# Patient Record
Sex: Male | Born: 1960 | Race: Black or African American | Hispanic: No | Marital: Married
Health system: Southern US, Community
[De-identification: ages and names within clinical notes are randomized; demographics above are authoritative.]

## PROBLEM LIST (undated history)

## (undated) DIAGNOSIS — C801 Malignant (primary) neoplasm, unspecified: Secondary | ICD-10-CM

## (undated) HISTORY — PX: JOINT REPLACEMENT: SHX530

## (undated) HISTORY — PX: PROSTATECTOMY: SHX69

## (undated) HISTORY — PX: KNEE SURGERY: SHX244

---

## 2004-12-23 ENCOUNTER — Emergency Department (HOSPITAL_COMMUNITY): Admission: EM | Admit: 2004-12-23 | Discharge: 2004-12-23 | Payer: Self-pay | Admitting: Emergency Medicine

## 2005-05-11 ENCOUNTER — Emergency Department (HOSPITAL_COMMUNITY): Admission: EM | Admit: 2005-05-11 | Discharge: 2005-05-11 | Payer: Self-pay | Admitting: Emergency Medicine

## 2006-05-20 ENCOUNTER — Emergency Department (HOSPITAL_COMMUNITY): Admission: EM | Admit: 2006-05-20 | Discharge: 2006-05-20 | Payer: Self-pay | Admitting: Emergency Medicine

## 2006-05-23 ENCOUNTER — Emergency Department (HOSPITAL_COMMUNITY): Admission: EM | Admit: 2006-05-23 | Discharge: 2006-05-23 | Payer: Self-pay | Admitting: Emergency Medicine

## 2006-06-03 ENCOUNTER — Ambulatory Visit (HOSPITAL_COMMUNITY): Admission: RE | Admit: 2006-06-03 | Discharge: 2006-06-03 | Payer: Self-pay | Admitting: Family Medicine

## 2006-08-18 ENCOUNTER — Ambulatory Visit (HOSPITAL_COMMUNITY): Admission: RE | Admit: 2006-08-18 | Discharge: 2006-08-18 | Payer: Self-pay | Admitting: Family Medicine

## 2006-09-17 ENCOUNTER — Ambulatory Visit (HOSPITAL_BASED_OUTPATIENT_CLINIC_OR_DEPARTMENT_OTHER): Admission: RE | Admit: 2006-09-17 | Discharge: 2006-09-17 | Payer: Self-pay | Admitting: Urology

## 2009-12-07 ENCOUNTER — Encounter: Admission: RE | Admit: 2009-12-07 | Discharge: 2010-03-07 | Payer: Self-pay | Admitting: Nurse Practitioner

## 2010-03-09 ENCOUNTER — Encounter
Admission: RE | Admit: 2010-03-09 | Discharge: 2010-04-11 | Payer: Self-pay | Source: Home / Self Care | Admitting: Nurse Practitioner

## 2010-07-11 ENCOUNTER — Emergency Department (HOSPITAL_BASED_OUTPATIENT_CLINIC_OR_DEPARTMENT_OTHER)
Admission: EM | Admit: 2010-07-11 | Discharge: 2010-07-11 | Payer: Self-pay | Source: Home / Self Care | Admitting: Emergency Medicine

## 2010-11-16 NOTE — Op Note (Signed)
NAME:  Lawrence Ford, Lawrence Ford               ACCOUNT NO.:  [id number]   MEDICAL RECORD NO.:  [id number]          PATIENT TYPE:  AMB   LOCATION:  [facility]                         FACILITY:  [facility]   PHYSICIAN:  Courtney Paris, M.D.DATE OF BIRTH:  [birth year]   DATE OF PROCEDURE:  09/17/2006  DATE OF DISCHARGE:                               OPERATIVE REPORT   PREOPERATIVE DIAGNOSIS:  Phimosis.   POSTOPERATIVE DIAGNOSIS:  Phimosis.   OPERATION:  Circumcision.   ANESTHESIA:  General.   SURGEON:  Courtney Paris, M.D.   BRIEF HISTORY:  This 50 year old male was admitted for circumcision for  phimosis and penile irritation, particularly with intercourse.  He has  never had a UTI.  He enters to have an elective circumcision done.   DESCRIPTION OF PROCEDURE:  The patient was placed on the operating room  in the supine position. After the satisfactory induction of general  anesthesia, was prepped and draped with Betadine in the usual sterile  fashion.  The foreskin was then retracted and the prep was completed.  A  circumferential incision was made around the coronal sulcus and also on  the redundant foreskin and the Bovie with fine needle electrode was then  used to remove the redundant foreskin.  Hemostasis was achieved by  electrocoagulation and the edges of the skin were then reapproximated  with interrupted 4-0 chromic catgut suture circumferentially.  When half  of the sutures had been placed, 10 mL of 0.25% Marcaine was injected  around the base of the penis for postoperative pain relief.  The patient  was then taken to the recovery room in good condition with the dressing  of collodion, Vaseline gauze, sterile dry bandage, and Coban.  He will  be later discharged as an outpatient with detailed written instructions.      Courtney Paris, M.D.  Electronically Signed     HMK/MEDQ  D:  09/17/2006  T:  09/17/2006  Job:  [id number]

## 2012-01-04 ENCOUNTER — Encounter (HOSPITAL_COMMUNITY): Payer: Self-pay | Admitting: *Deleted

## 2012-01-04 ENCOUNTER — Emergency Department (HOSPITAL_COMMUNITY)
Admission: EM | Admit: 2012-01-04 | Discharge: 2012-01-04 | Disposition: A | Discharge status: Home / Self Care | Attending: Emergency Medicine | Admitting: Emergency Medicine

## 2012-01-04 DIAGNOSIS — W57XXXA Bitten or stung by nonvenomous insect and other nonvenomous arthropods, initial encounter: Secondary | ICD-10-CM

## 2012-01-04 DIAGNOSIS — IMO0001 Reserved for inherently not codable concepts without codable children: Secondary | ICD-10-CM | POA: Insufficient documentation

## 2012-01-04 DIAGNOSIS — Z859 Personal history of malignant neoplasm, unspecified: Secondary | ICD-10-CM | POA: Insufficient documentation

## 2012-01-04 DIAGNOSIS — Z87891 Personal history of nicotine dependence: Secondary | ICD-10-CM | POA: Insufficient documentation

## 2012-01-04 HISTORY — DX: Malignant (primary) neoplasm, unspecified: C80.1

## 2012-01-04 MED ORDER — DIPHENHYDRAMINE HCL 25 MG PO CAPS: 25.0000 mg | ORAL_CAPSULE | Freq: Four times a day (QID) | ORAL | Status: DC | PRN

## 2012-01-04 MED ORDER — IBUPROFEN 800 MG PO TABS: 800.0000 mg | ORAL_TABLET | Freq: Once | ORAL | Status: AC

## 2012-01-04 MED ORDER — DIPHENHYDRAMINE HCL 25 MG PO CAPS
25.0000 mg | ORAL_CAPSULE | Freq: Once | ORAL | Status: AC
  Administered 2012-01-04: 25 mg via ORAL
  Filled 2012-01-04: qty 1

## 2012-01-04 MED ORDER — IBUPROFEN 800 MG PO TABS
800.0000 mg | ORAL_TABLET | Freq: Once | ORAL | Status: AC
  Administered 2012-01-04: 800 mg via ORAL
  Filled 2012-01-04: qty 1

## 2012-01-04 NOTE — ED Provider Notes (Signed)
History     CSN: [id number]  Arrival date & time 01/04/12  0017   None     Chief Complaint  Patient presents with  . Insect Bite    (Consider location/radiation/quality/duration/timing/severity/associated sxs/prior treatment) HPI Comments: Insect bite to upper posterior R arm 4 days ago  Has been red and itchy since   The history is provided by the patient.    Past Medical History  Diagnosis Date  . Cancer     Past Surgical History  Procedure Date  . Joint replacement     History reviewed. No pertinent family history.  History  Substance Use Topics  . Smoking status: Former Games developer  . Smokeless tobacco: Not on file  . Alcohol Use: No      Review of Systems  Constitutional: Negative for fever and chills.  Musculoskeletal: Negative for myalgias and joint swelling.  Skin: Negative for pallor, rash and wound.  Neurological: Negative for dizziness and weakness.    Allergies  Review of patient's allergies indicates no known allergies.  Home Medications   Current Outpatient Rx  Name Route Sig Dispense Refill  . HYDROCODONE-ACETAMINOPHEN 5-500 MG PO TABS Oral Take 1 tablet by mouth every 6 (six) hours as needed. For pain    . DIPHENHYDRAMINE HCL 25 MG PO CAPS Oral Take 1 capsule (25 mg total) by mouth every 6 (six) hours as needed for itching. 30 capsule 0  . IBUPROFEN 800 MG PO TABS Oral Take 1 tablet (800 mg total) by mouth once. 30 tablet 0    BP 118/84  Pulse 69  Temp 98.1 F (36.7 C) (Oral)  Resp 18  SpO2 99%  Physical Exam  Constitutional: He appears well-developed and well-nourished.  HENT:  Head: Normocephalic.  Eyes: Pupils are equal, round, and reactive to light.  Neck: Normal range of motion.  Cardiovascular: Normal rate.   Pulmonary/Chest: Effort normal.  Abdominal: Soft.  Genitourinary: Penis normal.  Neurological: He is alert.  Skin: Skin is warm. There is erythema.       Small area posterior R upper arm slightly reddened non tender  no indication of abscess     ED Course  Procedures (including critical care time)  Labs Reviewed - No data to display No results found.   1. Insect bite of arm, right       MDM  Insect bite with local reaction        Arman Filter, NP 01/04/12 0231  Arman Filter, NP 01/04/12 [phone]

## 2012-01-04 NOTE — ED Provider Notes (Signed)
Medical screening examination/treatment/procedure(s) were performed by non-physician practitioner and as supervising physician I was immediately available for consultation/collaboration.   Malajah Oceguera, MD 01/04/12 0437 

## 2012-01-04 NOTE — ED Notes (Addendum)
Pt c/o itching and swelling after being bitten by unknown insect on posterior R upper arm. Relieved w/ hydrocodone-apap that he took at 2230 Friday. Pt's arm is warmer to the touch.

## 2012-06-04 ENCOUNTER — Ambulatory Visit: Payer: Non-veteran care | Attending: Physician Assistant

## 2012-06-04 DIAGNOSIS — IMO0001 Reserved for inherently not codable concepts without codable children: Secondary | ICD-10-CM | POA: Insufficient documentation

## 2012-06-04 DIAGNOSIS — R269 Unspecified abnormalities of gait and mobility: Secondary | ICD-10-CM | POA: Insufficient documentation

## 2012-06-04 DIAGNOSIS — M25669 Stiffness of unspecified knee, not elsewhere classified: Secondary | ICD-10-CM | POA: Insufficient documentation

## 2012-06-04 DIAGNOSIS — M25569 Pain in unspecified knee: Secondary | ICD-10-CM | POA: Insufficient documentation

## 2012-06-04 DIAGNOSIS — M6281 Muscle weakness (generalized): Secondary | ICD-10-CM | POA: Insufficient documentation

## 2012-06-05 ENCOUNTER — Ambulatory Visit: Payer: Non-veteran care

## 2012-06-08 ENCOUNTER — Ambulatory Visit: Payer: Non-veteran care | Admitting: Physical Therapy

## 2012-06-10 ENCOUNTER — Ambulatory Visit: Payer: Non-veteran care

## 2012-06-12 ENCOUNTER — Ambulatory Visit: Payer: Non-veteran care | Admitting: Physical Therapy

## 2012-06-15 ENCOUNTER — Ambulatory Visit: Payer: Non-veteran care

## 2012-06-17 ENCOUNTER — Ambulatory Visit: Payer: Non-veteran care

## 2012-06-19 ENCOUNTER — Ambulatory Visit: Payer: Non-veteran care | Admitting: Physical Therapy

## 2012-06-22 ENCOUNTER — Ambulatory Visit: Payer: Non-veteran care

## 2012-06-23 ENCOUNTER — Encounter

## 2012-06-26 ENCOUNTER — Encounter: Admitting: Physical Therapy

## 2012-06-29 ENCOUNTER — Ambulatory Visit: Payer: Non-veteran care | Admitting: Physical Therapy

## 2012-06-30 ENCOUNTER — Ambulatory Visit: Payer: Non-veteran care | Admitting: Physical Therapy

## 2012-07-02 ENCOUNTER — Ambulatory Visit: Payer: Non-veteran care | Attending: Physician Assistant

## 2012-07-02 DIAGNOSIS — R269 Unspecified abnormalities of gait and mobility: Secondary | ICD-10-CM | POA: Insufficient documentation

## 2012-07-02 DIAGNOSIS — Z96659 Presence of unspecified artificial knee joint: Secondary | ICD-10-CM | POA: Insufficient documentation

## 2012-07-02 DIAGNOSIS — M6281 Muscle weakness (generalized): Secondary | ICD-10-CM | POA: Insufficient documentation

## 2012-07-02 DIAGNOSIS — M25669 Stiffness of unspecified knee, not elsewhere classified: Secondary | ICD-10-CM | POA: Insufficient documentation

## 2012-07-02 DIAGNOSIS — IMO0001 Reserved for inherently not codable concepts without codable children: Secondary | ICD-10-CM | POA: Insufficient documentation

## 2012-07-02 DIAGNOSIS — M25569 Pain in unspecified knee: Secondary | ICD-10-CM | POA: Insufficient documentation

## 2012-07-06 ENCOUNTER — Ambulatory Visit: Payer: Non-veteran care | Admitting: Physical Therapy

## 2012-07-08 ENCOUNTER — Ambulatory Visit: Payer: Non-veteran care

## 2012-07-10 ENCOUNTER — Ambulatory Visit: Payer: Non-veteran care | Admitting: Physical Therapy

## 2012-07-13 ENCOUNTER — Encounter

## 2012-07-14 ENCOUNTER — Ambulatory Visit: Payer: Non-veteran care | Admitting: Physical Therapy

## 2012-07-15 ENCOUNTER — Encounter

## 2012-07-16 ENCOUNTER — Ambulatory Visit: Payer: Non-veteran care

## 2012-07-17 ENCOUNTER — Encounter: Admitting: Physical Therapy

## 2012-07-20 ENCOUNTER — Ambulatory Visit: Payer: Non-veteran care | Admitting: Physical Therapy

## 2012-07-22 ENCOUNTER — Ambulatory Visit: Payer: Non-veteran care

## 2012-07-24 ENCOUNTER — Ambulatory Visit: Payer: Non-veteran care | Admitting: Physical Therapy

## 2012-07-27 ENCOUNTER — Ambulatory Visit: Payer: Non-veteran care | Admitting: Physical Therapy

## 2012-07-29 ENCOUNTER — Ambulatory Visit: Payer: Non-veteran care

## 2012-07-30 ENCOUNTER — Ambulatory Visit: Payer: Non-veteran care | Admitting: Physical Therapy

## 2012-07-31 ENCOUNTER — Encounter: Admitting: Physical Therapy

## 2012-08-03 ENCOUNTER — Ambulatory Visit: Payer: Non-veteran care | Attending: Physician Assistant | Admitting: Physical Therapy

## 2012-08-03 DIAGNOSIS — IMO0001 Reserved for inherently not codable concepts without codable children: Secondary | ICD-10-CM | POA: Insufficient documentation

## 2012-08-03 DIAGNOSIS — M25669 Stiffness of unspecified knee, not elsewhere classified: Secondary | ICD-10-CM | POA: Insufficient documentation

## 2012-08-03 DIAGNOSIS — M6281 Muscle weakness (generalized): Secondary | ICD-10-CM | POA: Insufficient documentation

## 2012-08-03 DIAGNOSIS — R269 Unspecified abnormalities of gait and mobility: Secondary | ICD-10-CM | POA: Insufficient documentation

## 2012-08-03 DIAGNOSIS — M25569 Pain in unspecified knee: Secondary | ICD-10-CM | POA: Insufficient documentation

## 2012-08-03 DIAGNOSIS — Z96659 Presence of unspecified artificial knee joint: Secondary | ICD-10-CM | POA: Insufficient documentation

## 2012-08-05 ENCOUNTER — Ambulatory Visit: Payer: Non-veteran care

## 2012-08-07 ENCOUNTER — Encounter: Admitting: Physical Therapy

## 2012-08-10 ENCOUNTER — Ambulatory Visit: Payer: Non-veteran care | Admitting: Physical Therapy

## 2012-08-12 ENCOUNTER — Ambulatory Visit: Payer: Non-veteran care

## 2012-08-14 ENCOUNTER — Encounter: Admitting: Physical Therapy

## 2012-08-17 ENCOUNTER — Ambulatory Visit: Payer: Non-veteran care | Admitting: Physical Therapy

## 2012-08-19 ENCOUNTER — Ambulatory Visit: Payer: Non-veteran care

## 2012-08-21 ENCOUNTER — Encounter: Admitting: Physical Therapy

## 2012-08-24 ENCOUNTER — Ambulatory Visit: Payer: Non-veteran care | Admitting: Physical Therapy

## 2012-08-26 ENCOUNTER — Ambulatory Visit: Payer: Non-veteran care

## 2012-08-28 ENCOUNTER — Encounter: Admitting: Physical Therapy

## 2012-08-31 ENCOUNTER — Ambulatory Visit: Payer: Non-veteran care | Attending: Physician Assistant | Admitting: Physical Therapy

## 2012-08-31 DIAGNOSIS — R269 Unspecified abnormalities of gait and mobility: Secondary | ICD-10-CM | POA: Insufficient documentation

## 2012-08-31 DIAGNOSIS — M25669 Stiffness of unspecified knee, not elsewhere classified: Secondary | ICD-10-CM | POA: Insufficient documentation

## 2012-08-31 DIAGNOSIS — IMO0001 Reserved for inherently not codable concepts without codable children: Secondary | ICD-10-CM | POA: Insufficient documentation

## 2012-08-31 DIAGNOSIS — M25569 Pain in unspecified knee: Secondary | ICD-10-CM | POA: Insufficient documentation

## 2012-08-31 DIAGNOSIS — M6281 Muscle weakness (generalized): Secondary | ICD-10-CM | POA: Insufficient documentation

## 2012-09-02 ENCOUNTER — Ambulatory Visit: Payer: Non-veteran care

## 2012-09-07 ENCOUNTER — Ambulatory Visit: Payer: Non-veteran care | Admitting: Physical Therapy

## 2012-09-09 ENCOUNTER — Ambulatory Visit: Payer: Non-veteran care | Admitting: Physical Therapy

## 2012-09-14 ENCOUNTER — Ambulatory Visit: Payer: Non-veteran care

## 2012-09-18 ENCOUNTER — Ambulatory Visit: Payer: Non-veteran care

## 2015-05-03 ENCOUNTER — Emergency Department (HOSPITAL_BASED_OUTPATIENT_CLINIC_OR_DEPARTMENT_OTHER)
Admission: EM | Admit: 2015-05-03 | Discharge: 2015-05-03 | Discharge status: Against Medical Advice | Attending: Dermatology | Admitting: Dermatology

## 2015-05-03 ENCOUNTER — Encounter (HOSPITAL_BASED_OUTPATIENT_CLINIC_OR_DEPARTMENT_OTHER): Payer: Self-pay

## 2015-05-03 DIAGNOSIS — W450XXA Nail entering through skin, initial encounter: Secondary | ICD-10-CM | POA: Diagnosis not present

## 2015-05-03 DIAGNOSIS — Y998 Other external cause status: Secondary | ICD-10-CM | POA: Diagnosis not present

## 2015-05-03 DIAGNOSIS — S99922A Unspecified injury of left foot, initial encounter: Secondary | ICD-10-CM | POA: Diagnosis not present

## 2015-05-03 DIAGNOSIS — Y9289 Other specified places as the place of occurrence of the external cause: Secondary | ICD-10-CM | POA: Insufficient documentation

## 2015-05-03 DIAGNOSIS — Y9389 Activity, other specified: Secondary | ICD-10-CM | POA: Diagnosis not present

## 2015-05-03 NOTE — ED Notes (Addendum)
Stepped on rusty nail, left foot, approx 930pm-denies nail in foot-requesting tdap

## 2015-05-04 ENCOUNTER — Emergency Department (HOSPITAL_COMMUNITY)
Admission: EM | Admit: 2015-05-04 | Discharge: 2015-05-04 | Disposition: A | Discharge status: Home / Self Care | Payer: Worker's Compensation | Attending: Emergency Medicine | Admitting: Emergency Medicine

## 2015-05-04 ENCOUNTER — Encounter (HOSPITAL_COMMUNITY): Payer: Self-pay | Admitting: Emergency Medicine

## 2015-05-04 DIAGNOSIS — Z23 Encounter for immunization: Secondary | ICD-10-CM | POA: Diagnosis not present

## 2015-05-04 DIAGNOSIS — Y9289 Other specified places as the place of occurrence of the external cause: Secondary | ICD-10-CM | POA: Diagnosis not present

## 2015-05-04 DIAGNOSIS — Y9389 Activity, other specified: Secondary | ICD-10-CM | POA: Diagnosis not present

## 2015-05-04 DIAGNOSIS — W450XXA Nail entering through skin, initial encounter: Secondary | ICD-10-CM | POA: Diagnosis not present

## 2015-05-04 DIAGNOSIS — S91331A Puncture wound without foreign body, right foot, initial encounter: Secondary | ICD-10-CM | POA: Diagnosis present

## 2015-05-04 DIAGNOSIS — Z859 Personal history of malignant neoplasm, unspecified: Secondary | ICD-10-CM | POA: Insufficient documentation

## 2015-05-04 DIAGNOSIS — Z87891 Personal history of nicotine dependence: Secondary | ICD-10-CM | POA: Diagnosis not present

## 2015-05-04 DIAGNOSIS — Y998 Other external cause status: Secondary | ICD-10-CM | POA: Diagnosis not present

## 2015-05-04 DIAGNOSIS — Z79899 Other long term (current) drug therapy: Secondary | ICD-10-CM | POA: Insufficient documentation

## 2015-05-04 MED ORDER — CIPROFLOXACIN HCL 750 MG PO TABS: 750.0000 mg | ORAL_TABLET | Freq: Two times a day (BID) | ORAL | Status: DC

## 2015-05-04 MED ORDER — TETANUS-DIPHTH-ACELL PERTUSSIS 5-2.5-18.5 LF-MCG/0.5 IM SUSP
0.5000 mL | Freq: Once | INTRAMUSCULAR | Status: AC
  Administered 2015-05-04: 0.5 mL via INTRAMUSCULAR
  Filled 2015-05-04: qty 0.5

## 2015-05-04 NOTE — ED Notes (Signed)
Pt comfortable with discharge and follow up instructions. Pt declines wheelchair, escorted to waiting area by this RN. Prescriptions x1. 

## 2015-05-04 NOTE — ED Provider Notes (Signed)
CSN: [id number]     Arrival date & time 05/04/15  7858 History   First MD Initiated Contact with Patient 05/04/15 1010     Chief Complaint  Patient presents with  . Foot Injury     (Consider location/radiation/quality/duration/timing/severity/associated sxs/prior Treatment) HPI   Lawrence Ford is a 54 y.o M with no significant pmhx who presents to the ED c/o foot injury. Pt states that he was wearing his work boots when he stepped on a rusted nail about 3 hrs PTA. Pt states that his foot bled briefly, but was minimal. Pt washed area with soap and water and applied alcohol. Pt does not know when his last tetanus shot was. Denies pain, decrease ROM, redness or swelling of affected area, muscles rigidity.   Past Medical History  Diagnosis Date  . Cancer [facility])    Past Surgical History  Procedure Laterality Date  . Joint replacement    . Prostatectomy    . Knee surgery     History reviewed. No pertinent family history. Social History  Substance Use Topics  . Smoking status: Former Research scientist (life sciences)  . Smokeless tobacco: None  . Alcohol Use: No    Review of Systems  All other systems reviewed and are negative.     Allergies  Review of patient's allergies indicates no known allergies.  Home Medications   Prior to Admission medications   Medication Sig Start Date End Date Taking? Authorizing Provider  atorvastatin (LIPITOR) 40 MG tablet Take 20 mg by mouth daily.    Yes Historical Provider, MD  ranitidine (ZANTAC) 150 MG tablet Take 150 mg by mouth every evening.   Yes Historical Provider, MD  ciprofloxacin (CIPRO) 750 MG tablet Take 1 tablet (750 mg total) by mouth 2 (two) times daily. 05/04/15   Lawrence Chhim Tripp Jaxan Michel, PA-C   BP 108/68 mmHg  Pulse 75  Temp(Src) 97.7 F (36.5 C) (Oral)  SpO2 92% Physical Exam  Constitutional: He is oriented to person, place, and time. He appears well-developed and well-nourished. No distress.  HENT:  Head: Normocephalic and atraumatic.   Mouth/Throat: No oropharyngeal exudate.  Eyes: Conjunctivae are normal. Right eye exhibits no discharge. Left eye exhibits no discharge. No scleral icterus.  Cardiovascular: Normal rate and intact distal pulses.   Pulmonary/Chest: Effort normal.  Abdominal: Soft.  Musculoskeletal: Normal range of motion. He exhibits no edema or tenderness.  No decrease ROM of ankle.   Neurological: He is alert and oriented to person, place, and time.  Skin: Skin is warm and dry. No rash noted. He is not diaphoretic. No erythema. No pallor.  <0.5 cm superficial punctate wound on plantar surface of R foot. No edema, erythema, ecchymosis.   Psychiatric: He has a normal mood and affect. His behavior is normal.  Nursing note and vitals reviewed.   ED Course  Procedures (including critical care time) Labs Review Labs Reviewed - No data to display  Imaging Review No results found. I have personally reviewed and evaluated these images and lab results as part of my medical decision-making.   EKG Interpretation None      MDM   Final diagnoses:  Puncture wound of skin from metal nail    Otherwise healthy 54 y.o M presents requesting tetanus shot after stepping on rusty nail at work. Pt was wearing work boots. <0.5 cm punctate wound on plantar surface of R foot. No concern for foreign body. Pt brought nail with him to ED. Will update tetanus. Will d/c with cipro to cover pseudomonas  as pt was wearing shoe when injury occurred. Pt in NAD, VSS. Return precautions outlined in patient discharge instructions.      Dondra Spry [place], PA-C 05/04/15 [place], MD 05/05/15 1014

## 2015-05-04 NOTE — ED Notes (Signed)
Pt from home with c/o left foot injury from stepping on a nail in a wooden board last night at work.  Pt was wearing regular soled shoes.  Unknown last tetanus shot date.  Mild redness and swelling noted to bottom of foot.  All of nail was removed.  NAD, A&O.

## 2015-05-04 NOTE — Discharge Instructions (Signed)
Return to the Emergency Department if you experience fever, redness, swelling or drainage around wound, stiffening of muscles. Keep area clean and dry.

## 2016-10-22 ENCOUNTER — Encounter (HOSPITAL_COMMUNITY): Payer: Self-pay | Admitting: Emergency Medicine

## 2016-10-22 ENCOUNTER — Emergency Department (HOSPITAL_COMMUNITY)
Admission: EM | Admit: 2016-10-22 | Discharge: 2016-10-22 | Disposition: A | Discharge status: Home / Self Care | Payer: Non-veteran care | Attending: Dermatology | Admitting: Dermatology

## 2016-10-22 DIAGNOSIS — Z5321 Procedure and treatment not carried out due to patient leaving prior to being seen by health care provider: Secondary | ICD-10-CM | POA: Diagnosis not present

## 2016-10-22 DIAGNOSIS — M7989 Other specified soft tissue disorders: Secondary | ICD-10-CM | POA: Diagnosis not present

## 2016-10-22 NOTE — ED Notes (Signed)
Pt stated that the wait is too long and he is leaving

## 2016-10-22 NOTE — ED Triage Notes (Signed)
Pt from home.  Hx bilat knee replacement, PCP doc at [facility] not available and sent here.  Reports front L knee pain that migrated to back of calf and down leg.  Visible swelling on L calf only.  Ambulatory.  No hx clots, not on blood thinners.

## 2016-11-22 ENCOUNTER — Ambulatory Visit (HOSPITAL_COMMUNITY)
Admission: EM | Admit: 2016-11-22 | Discharge: 2016-11-22 | Disposition: A | Discharge status: Home / Self Care | Attending: Internal Medicine | Admitting: Internal Medicine

## 2016-11-22 ENCOUNTER — Encounter (HOSPITAL_COMMUNITY): Payer: Self-pay | Admitting: *Deleted

## 2016-11-22 DIAGNOSIS — S0181XA Laceration without foreign body of other part of head, initial encounter: Secondary | ICD-10-CM | POA: Diagnosis not present

## 2016-11-22 MED ORDER — ONDANSETRON 4 MG PO TBDP: 4.0000 mg | ORAL_TABLET | Freq: Once | ORAL | Status: DC

## 2016-11-22 NOTE — ED Provider Notes (Signed)
CSN: [id number]     Arrival date & time 11/22/16  1415 History   None    Chief Complaint  Patient presents with  . Laceration   (Consider location/radiation/quality/duration/timing/severity/associated sxs/prior Treatment) Patient accidentally cut his right forearm with a knife and his TD is UTD.   The history is provided by the patient.  Laceration  Location:  Shoulder/arm Shoulder/arm laceration location:  R forearm Length:  1 cm Depth:  Through dermis Quality: straight   Bleeding: venous and controlled   Time since incident:  1 hour Laceration mechanism:  Knife Pain details:    Quality:  Aching   Timing:  Constant Foreign body present:  No foreign bodies Relieved by:  Nothing   Past Medical History:  Diagnosis Date  . Cancer [facility])    prostate   Past Surgical History:  Procedure Laterality Date  . JOINT REPLACEMENT    . KNEE SURGERY    . PROSTATECTOMY     Family History  Problem Relation Age of Onset  . Hypertension Father    Social History  Substance Use Topics  . Smoking status: Former Research scientist (life sciences)  . Smokeless tobacco: Never Used  . Alcohol use No    Review of Systems  Constitutional: Negative.   HENT: Negative.   Eyes: Negative.   Respiratory: Negative.   Cardiovascular: Negative.   Gastrointestinal: Negative.   Endocrine: Negative.   Genitourinary: Negative.   Musculoskeletal: Negative.   Skin: Positive for wound.  Allergic/Immunologic: Negative.   Neurological: Negative.   Hematological: Negative.   Psychiatric/Behavioral: Negative.     Allergies  Patient has no known allergies.  Home Medications   Prior to Admission medications   Medication Sig Start Date End Date Taking? Authorizing Provider  atorvastatin (LIPITOR) 40 MG tablet Take 20 mg by mouth daily.    Yes [provider]  ranitidine (ZANTAC) 150 MG tablet Take 150 mg by mouth every evening.   Yes [provider]  ciprofloxacin (CIPRO) 750 MG tablet Take 1 tablet  (750 mg total) by mouth 2 (two) times daily. 05/04/15   [place], Dondra Spry, PA-C   Meds Ordered and Administered this Visit  Medications - No data to display  BP 126/72   Pulse 64   Temp 98 F (36.7 C) (Oral)   Resp 17  No data found.   Physical Exam  Constitutional: He appears well-developed and well-nourished.  HENT:  Head: Normocephalic and atraumatic.  Eyes: Conjunctivae and EOM are normal. Pupils are equal, round, and reactive to light.  Neck: Normal range of motion. Neck supple.  Cardiovascular: Normal rate, regular rhythm and normal heart sounds.   Pulmonary/Chest: Effort normal and breath sounds normal.  Abdominal: Soft. Bowel sounds are normal.  Skin:  Right forearm with 1 cm laceration.  Nursing note and vitals reviewed.   Urgent Care Course     .Marland KitchenLaceration Repair Date/Time: 11/22/2016 3:45 PM Performed by: Lysbeth Penner Authorized by: Sherlene Shams   Consent:    Consent obtained:  Verbal   Consent given by:  Patient   Risks discussed:  Infection   Alternatives discussed:  No treatment Anesthesia (see MAR for exact dosages):    Anesthesia method:  None Laceration details:    Location:  Shoulder/arm   Shoulder/arm location:  R lower arm   Length (cm):  1   Depth (mm):  3 Repair type:    Repair type:  Simple Pre-procedure details:    Preparation:  Patient was prepped and draped in usual  sterile fashion Exploration:    Hemostasis achieved with:  Direct pressure   Wound exploration: wound explored through full range of motion     Contaminated: no   Treatment:    Area cleansed with:  Betadine Skin repair:    Repair method:  Tissue adhesive Approximation:    Approximation:  Close   Vermilion border: well-aligned   Post-procedure details:    Dressing:  Adhesive bandage   (including critical care time)  Labs Review Labs Reviewed - No data to display  Imaging Review No results found.   Visual Acuity Review  Right Eye Distance:    Left Eye Distance:   Bilateral Distance:    Right Eye Near:   Left Eye Near:    Bilateral Near:         MDM   1. Laceration of forehead, initial encounter    Wound adhesive applied to right FA laceration      Lysbeth Penner, FNP 11/22/16 1546

## 2016-11-22 NOTE — ED Notes (Signed)
Patients arm dressed with kerlex.

## 2016-11-22 NOTE — ED Triage Notes (Signed)
Patient reports he was doing the dishes and a knife was sticking out of the drainer and pierced his left lateral forearm. Building controled. Tetanus up to date.

## 2018-03-20 ENCOUNTER — Other Ambulatory Visit: Payer: Self-pay

## 2018-03-20 DIAGNOSIS — Z1211 Encounter for screening for malignant neoplasm of colon: Secondary | ICD-10-CM

## 2018-03-23 ENCOUNTER — Other Ambulatory Visit: Payer: Self-pay

## 2018-04-07 ENCOUNTER — Ambulatory Visit: Admitting: Anesthesiology

## 2018-04-07 ENCOUNTER — Encounter
Admission: RE | Disposition: A | Discharge status: Home / Self Care | Payer: Self-pay | Source: Ambulatory Visit | Attending: Gastroenterology

## 2018-04-07 ENCOUNTER — Ambulatory Visit
Admission: RE | Admit: 2018-04-07 | Discharge: 2018-04-07 | Disposition: A | Discharge status: Home / Self Care | Source: Ambulatory Visit | Attending: Gastroenterology | Admitting: Gastroenterology

## 2018-04-07 ENCOUNTER — Encounter: Payer: Self-pay | Admitting: Anesthesiology

## 2018-04-07 DIAGNOSIS — Z8249 Family history of ischemic heart disease and other diseases of the circulatory system: Secondary | ICD-10-CM | POA: Diagnosis not present

## 2018-04-07 DIAGNOSIS — Z8546 Personal history of malignant neoplasm of prostate: Secondary | ICD-10-CM | POA: Diagnosis not present

## 2018-04-07 DIAGNOSIS — Z9660 Presence of unspecified orthopedic joint implant: Secondary | ICD-10-CM | POA: Insufficient documentation

## 2018-04-07 DIAGNOSIS — E785 Hyperlipidemia, unspecified: Secondary | ICD-10-CM | POA: Insufficient documentation

## 2018-04-07 DIAGNOSIS — Z1211 Encounter for screening for malignant neoplasm of colon: Secondary | ICD-10-CM

## 2018-04-07 DIAGNOSIS — K641 Second degree hemorrhoids: Secondary | ICD-10-CM | POA: Diagnosis not present

## 2018-04-07 DIAGNOSIS — Z87891 Personal history of nicotine dependence: Secondary | ICD-10-CM | POA: Diagnosis not present

## 2018-04-07 DIAGNOSIS — K219 Gastro-esophageal reflux disease without esophagitis: Secondary | ICD-10-CM | POA: Insufficient documentation

## 2018-04-07 DIAGNOSIS — D123 Benign neoplasm of transverse colon: Secondary | ICD-10-CM

## 2018-04-07 DIAGNOSIS — Z79899 Other long term (current) drug therapy: Secondary | ICD-10-CM | POA: Diagnosis not present

## 2018-04-07 HISTORY — PX: COLONOSCOPY WITH PROPOFOL: SHX5780

## 2018-04-07 SURGERY — COLONOSCOPY WITH PROPOFOL
Anesthesia: General

## 2018-04-07 MED ORDER — PROPOFOL 500 MG/50ML IV EMUL
INTRAVENOUS | Status: DC | PRN
  Administered 2018-04-07: 100 ug/kg/min via INTRAVENOUS

## 2018-04-07 MED ORDER — LIDOCAINE HCL (CARDIAC) PF 100 MG/5ML IV SOSY
PREFILLED_SYRINGE | INTRAVENOUS | Status: DC | PRN
  Administered 2018-04-07: 80 mg via INTRAVENOUS

## 2018-04-07 MED ORDER — PROPOFOL 10 MG/ML IV BOLUS
INTRAVENOUS | Status: DC | PRN
  Administered 2018-04-07: 80 mg via INTRAVENOUS

## 2018-04-07 MED ORDER — SODIUM CHLORIDE 0.9 % IV SOLN
INTRAVENOUS | Status: DC
  Administered 2018-04-07: 1000 mL via INTRAVENOUS

## 2018-04-07 NOTE — Anesthesia Post-op Follow-up Note (Signed)
Anesthesia QCDR form completed.        

## 2018-04-07 NOTE — Transfer of Care (Signed)
Immediate Anesthesia Transfer of Care Note  Patient: Lawrence Ford  Procedure(s) Performed: COLONOSCOPY WITH PROPOFOL (N/A )  Patient Location: PACU  Anesthesia Type:General  Level of Consciousness: sedated  Airway & Oxygen Therapy: Patient Spontanous Breathing and Patient connected to nasal cannula oxygen  Post-op Assessment: Report given to RN and Post -op Vital signs reviewed and stable  Post vital signs: Reviewed and stable  Last Vitals:  Vitals Value Taken Time  BP    Temp    Pulse    Resp    SpO2      Last Pain:  Vitals:   04/07/18 0921  TempSrc: Tympanic  PainSc: 0-No pain         Complications: No apparent anesthesia complications

## 2018-04-07 NOTE — H&P (Signed)
Lucilla Lame, MD [address] [place], [place] [address] Phone: [phone] Fax : [phone]  Primary Care Physician:  Gale Journey, MD Primary Gastroenterologist:  Dr. Allen Norris  Pre-Procedure History & Physical: HPI:  Lawrence Ford is a 57 y.o. male is here for a screening colonoscopy.   Past Medical History:  Diagnosis Date  . Cancer [facility])    prostate    Past Surgical History:  Procedure Laterality Date  . JOINT REPLACEMENT    . KNEE SURGERY    . PROSTATECTOMY      Prior to Admission medications   Medication Sig Start Date End Date Taking? Authorizing Provider  atorvastatin (LIPITOR) 40 MG tablet Take 20 mg by mouth daily.    Yes [provider]  ciprofloxacin (CIPRO) 750 MG tablet Take 1 tablet (750 mg total) by mouth 2 (two) times daily. 05/04/15  Yes Dowless, Dondra Spry, PA-C  ranitidine (ZANTAC) 150 MG tablet Take 150 mg by mouth every evening.   Yes [provider]    Allergies as of 03/20/2018  . (No Known Allergies)    Family History  Problem Relation Age of Onset  . Hypertension Father     Social History   Socioeconomic History  . Marital status: Married    Spouse name: Not on file  . Number of children: Not on file  . Years of education: Not on file  . Highest education level: Not on file  Occupational History  . Not on file  Social Needs  . Financial resource strain: Not on file  . Food insecurity:    Worry: Not on file    Inability: Not on file  . Transportation needs:    Medical: Not on file    Non-medical: Not on file  Tobacco Use  . Smoking status: Former Research scientist (life sciences)  . Smokeless tobacco: Never Used  Substance and Sexual Activity  . Alcohol use: No  . Drug use: No  . Sexual activity: Not on file  Lifestyle  . Physical activity:    Days per week: Not on file    Minutes per session: Not on file  . Stress: Not on file  Relationships  . Social connections:    Talks on phone: Not on file   Gets together: Not on file    Attends religious service: Not on file    Active member of club or organization: Not on file    Attends meetings of clubs or organizations: Not on file    Relationship status: Not on file  . Intimate partner violence:    Fear of current or ex partner: Not on file    Emotionally abused: Not on file    Physically abused: Not on file    Forced sexual activity: Not on file  Other Topics Concern  . Not on file  Social History Narrative  . Not on file    Review of Systems: See HPI, otherwise negative ROS  Physical Exam: BP 109/78   Pulse 70   Temp (!) 97 F (36.1 C) (Tympanic)   Resp 20   Ht 5\' 7"  (1.702 m)   Wt 86.2 kg   SpO2 100%   BMI 29.76 kg/m  General:   Alert,  pleasant and cooperative in NAD Head:  Normocephalic and atraumatic. Neck:  Supple; no masses or thyromegaly. Lungs:  Clear throughout to auscultation.    Heart:  Regular rate and rhythm. Abdomen:  Soft, nontender and nondistended. Normal bowel sounds, without guarding, and without  rebound.   Neurologic:  Alert and  oriented x4;  grossly normal neurologically.  Impression/Plan: Lawrence Ford is now here to undergo a screening colonoscopy.  Risks, benefits, and alternatives regarding colonoscopy have been reviewed with the patient.  Questions have been answered.  All parties agreeable.

## 2018-04-07 NOTE — Anesthesia Preprocedure Evaluation (Signed)
Anesthesia Evaluation  Patient identified by MRN, date of birth, ID band Patient awake    Reviewed: Allergy & Precautions, H&P , NPO status , Patient's Chart, lab work & pertinent test results  History of Anesthesia Complications Negative for: history of anesthetic complications  Airway Mallampati: II  TM Distance: >3 FB Neck ROM: full    Dental  (+) Chipped   Pulmonary neg shortness of breath, former smoker,           Cardiovascular Exercise Tolerance: Good (-) angina(-) Past MI and (-) DOE      Neuro/Psych negative neurological ROS  negative psych ROS   GI/Hepatic Neg liver ROS, GERD  ,  Endo/Other  negative endocrine ROS  Renal/GU negative Renal ROS  negative genitourinary   Musculoskeletal   Abdominal   Peds  Hematology negative hematology ROS (+)   Anesthesia Other Findings HLD  Past Medical History: No date: Cancer ([place])     Comment:  prostate  Past Surgical History: No date: JOINT REPLACEMENT No date: KNEE SURGERY No date: PROSTATECTOMY     Reproductive/Obstetrics negative OB ROS                             Anesthesia Physical Anesthesia Plan  ASA: II  Anesthesia Plan: General   Post-op Pain Management:    Induction: Intravenous  PONV Risk Score and Plan: Propofol infusion and TIVA  Airway Management Planned: Natural Airway and Nasal Cannula  Additional Equipment:   Intra-op Plan:   Post-operative Plan:   Informed Consent: I have reviewed the patients History and Physical, chart, labs and discussed the procedure including the risks, benefits and alternatives for the proposed anesthesia with the patient or authorized representative who has indicated his/her understanding and acceptance.   Dental Advisory Given  Plan Discussed with: Anesthesiologist, CRNA and Surgeon  Anesthesia Plan Comments: (Patient consented for risks of anesthesia including but not  limited to:  - adverse reactions to medications - risk of intubation if required - damage to teeth, lips or other oral mucosa - sore throat or hoarseness - Damage to heart, brain, lungs or loss of life  Patient voiced understanding.)        Anesthesia Quick Evaluation

## 2018-04-07 NOTE — Anesthesia Postprocedure Evaluation (Signed)
Anesthesia Post Note  Patient: Lawrence Ford  Procedure(s) Performed: COLONOSCOPY WITH PROPOFOL (N/A )  Patient location during evaluation: Endoscopy Anesthesia Type: General Level of consciousness: awake and alert Pain management: pain level controlled Vital Signs Assessment: post-procedure vital signs reviewed and stable Respiratory status: spontaneous breathing, nonlabored ventilation, respiratory function stable and patient connected to nasal cannula oxygen Cardiovascular status: blood pressure returned to baseline and stable Postop Assessment: no apparent nausea or vomiting Anesthetic complications: no     Last Vitals:  Vitals:   04/07/18 1011 04/07/18 1021  BP: 95/65 111/73  Pulse: (!) 59 (!) 55  Resp: 18 15  Temp:    SpO2: 99% 100%    Last Pain:  Vitals:   04/07/18 1021  TempSrc:   PainSc: 0-No pain                 Precious Haws Nelly Scriven

## 2018-04-07 NOTE — Op Note (Signed)
[facility] Patient Name: Lawrence Ford Procedure Date: 04/07/2018 9:35 AM MRN: [id number] Account #: [id number] Date of Birth: 21-Jun-1961 Admit Type: Outpatient Age: 57 Room: [facility] ENDO ROOM 4 Gender: Male Note Status: Finalized Procedure:            Colonoscopy Indications:          Screening for colorectal malignant neoplasm Providers:            Lucilla Lame MD, MD Referring MD:         No Local Md, MD (Referring MD) Medicines:            Propofol per Anesthesia Complications:        No immediate complications. Procedure:            Pre-Anesthesia Assessment:                       - Prior to the procedure, a History and Physical was                        performed, and patient medications and allergies were                        reviewed. The patient's tolerance of previous                        anesthesia was also reviewed. The risks and benefits of                        the procedure and the sedation options and risks were                        discussed with the patient. All questions were                        answered, and informed consent was obtained. Prior                        Anticoagulants: The patient has taken no previous                        anticoagulant or antiplatelet agents. ASA Grade                        Assessment: II - A patient with mild systemic disease.                        After reviewing the risks and benefits, the patient was                        deemed in satisfactory condition to undergo the                        procedure.                       After obtaining informed consent, the colonoscope was                        passed under direct vision. Throughout the procedure,  the patient's blood pressure, pulse, and oxygen                        saturations were monitored continuously. The                        Colonoscope was introduced through the anus and                         advanced to the the cecum, identified by appendiceal                        orifice and ileocecal valve. The colonoscopy was                        performed without difficulty. The patient tolerated the                        procedure well. The quality of the bowel preparation                        was excellent. Findings:      The perianal and digital rectal examinations were normal.      Two sessile polyps were found in the transverse colon. The polyps were 5       to 7 mm in size. These polyps were removed with a cold snare. Resection       and retrieval were complete.      A 2 mm polyp was found in the transverse colon. The polyp was sessile.       The polyp was removed with a cold biopsy forceps. Resection and       retrieval were complete.      Non-bleeding internal hemorrhoids were found during retroflexion. The       hemorrhoids were Grade II (internal hemorrhoids that prolapse but reduce       spontaneously). Impression:           - Two 5 to 7 mm polyps in the transverse colon, removed                        with a cold snare. Resected and retrieved.                       - One 2 mm polyp in the transverse colon, removed with                        a cold biopsy forceps. Resected and retrieved.                       - Non-bleeding internal hemorrhoids. Recommendation:       - Discharge patient to home.                       - Resume previous diet.                       - Continue present medications.                       - Await pathology results.                       -  Repeat colonoscopy in 5 years if polyp adenoma and 10                        years if hyperplastic Procedure Code(s):    --- Professional ---                       [id number], Colonoscopy, flexible; with removal of tumor(s),                        polyp(s), or other lesion(s) by snare technique                       45380, 66, Colonoscopy, flexible; with biopsy, single                        or  multiple Diagnosis Code(s):    --- Professional ---                       Z12.11, Encounter for screening for malignant neoplasm                        of colon                       D12.3, Benign neoplasm of transverse colon (hepatic                        flexure or splenic flexure) CPT copyright 2018 American Medical Association. All rights reserved. The codes documented in this report are preliminary and upon coder review may  be revised to meet current compliance requirements. Lucilla Lame MD, MD 04/07/2018 9:55:19 AM This report has been signed electronically. Number of Addenda: 0 Note Initiated On: 04/07/2018 9:35 AM Scope Withdrawal Time: 0 hours 8 minutes 24 seconds  Total Procedure Duration: 0 hours 9 minutes 50 seconds       [facility]

## 2018-04-08 ENCOUNTER — Encounter: Payer: Self-pay | Admitting: Gastroenterology

## 2018-04-08 LAB — SURGICAL PATHOLOGY

## 2018-04-12 ENCOUNTER — Encounter: Payer: Self-pay | Admitting: Gastroenterology

## 2018-05-12 ENCOUNTER — Ambulatory Visit (HOSPITAL_COMMUNITY)
Admission: EM | Admit: 2018-05-12 | Discharge: 2018-05-12 | Disposition: A | Discharge status: Home / Self Care | Attending: Family Medicine | Admitting: Family Medicine

## 2018-05-12 ENCOUNTER — Ambulatory Visit (INDEPENDENT_AMBULATORY_CARE_PROVIDER_SITE_OTHER)

## 2018-05-12 ENCOUNTER — Encounter (HOSPITAL_COMMUNITY): Payer: Self-pay | Admitting: Emergency Medicine

## 2018-05-12 DIAGNOSIS — M25571 Pain in right ankle and joints of right foot: Secondary | ICD-10-CM | POA: Diagnosis not present

## 2018-05-12 DIAGNOSIS — M79671 Pain in right foot: Secondary | ICD-10-CM

## 2018-05-12 MED ORDER — PREDNISONE 20 MG PO TABS: ORAL_TABLET | ORAL | 0 refills | Status: AC

## 2018-05-12 MED ORDER — IBUPROFEN 800 MG PO TABS: 800.0000 mg | ORAL_TABLET | Freq: Three times a day (TID) | ORAL | 0 refills | Status: DC

## 2018-05-12 NOTE — ED Provider Notes (Signed)
[place]    CSN: [id number] Arrival date & time: 05/12/18  1114     History   Chief Complaint Chief Complaint  Patient presents with  . Foot Pain    HPI Lawrence Ford is a 57 y.o. male.   HPI  Pain in the right great toe joint for a few days Pain with weight bearing Walks a lot at job No trauma or fall No new shoes NO history of gout or arthritis On no Rx medicines NO ALCOHOL  Past Medical History:  Diagnosis Date  . Cancer [facility])    prostate    Patient Active Problem List   Diagnosis Date Noted  . Encounter for screening colonoscopy   . Benign neoplasm of transverse colon     Past Surgical History:  Procedure Laterality Date  . COLONOSCOPY WITH PROPOFOL N/A 04/07/2018   Procedure: COLONOSCOPY WITH PROPOFOL;  Surgeon: Lucilla Lame, MD;  Location: [facility] ENDOSCOPY;  Service: Endoscopy;  Laterality: N/A;  . JOINT REPLACEMENT    . KNEE SURGERY    . PROSTATECTOMY        Home Medications    Prior to Admission medications   Medication Sig Start Date End Date Taking? Authorizing Provider  atorvastatin (LIPITOR) 40 MG tablet Take 20 mg by mouth daily.     [provider]  ibuprofen (ADVIL,MOTRIN) 800 MG tablet Take 1 tablet (800 mg total) by mouth 3 (three) times daily. 05/12/18   Raylene Everts, MD  predniSONE (DELTASONE) 20 MG tablet Take 3 pills today and tomorrow 05/12/18   Raylene Everts, MD  ranitidine (ZANTAC) 150 MG tablet Take 150 mg by mouth every evening.    [provider]    Family History Family History  Problem Relation Age of Onset  . Hypertension Father     Social History Social History   Tobacco Use  . Smoking status: Former Research scientist (life sciences)  . Smokeless tobacco: Never Used  Substance Use Topics  . Alcohol use: No  . Drug use: No     Allergies   Patient has no known allergies.   Review of Systems Review of Systems  Constitutional: Negative for chills and fever.  HENT: Negative for ear pain  and sore throat.   Eyes: Negative for pain and visual disturbance.  Respiratory: Negative for cough and shortness of breath.   Cardiovascular: Negative for chest pain and palpitations.  Gastrointestinal: Negative for abdominal pain and vomiting.  Genitourinary: Negative for dysuria and hematuria.  Musculoskeletal: Positive for arthralgias and gait problem. Negative for back pain.  Skin: Negative for color change and rash.  Neurological: Negative for seizures and syncope.  All other systems reviewed and are negative.    Physical Exam Triage Vital Signs ED Triage Vitals  Enc Vitals Group     BP 05/12/18 1133 122/74     Pulse Rate 05/12/18 1133 78     Resp 05/12/18 1133 18     Temp 05/12/18 1133 97.8 F (36.6 C)     Temp Source 05/12/18 1133 Oral     SpO2 05/12/18 1133 99 %     Weight --      Height --      Head Circumference --      Peak Flow --      Pain Score 05/12/18 1134 5     Pain Loc --      Pain Edu? --      Excl. in [place]? --    No data found.  Updated Vital Signs BP 122/74 (BP Location: Left Arm)   Pulse 78   Temp 97.8 F (36.6 C) (Oral)   Resp 18   SpO2 99%       Physical Exam  Constitutional: He appears well-developed and well-nourished. No distress.  HENT:  Head: Normocephalic and atraumatic.  Mouth/Throat: Oropharynx is clear and moist.  Eyes: Pupils are equal, round, and reactive to light. Conjunctivae are normal.  Neck: Normal range of motion.  Cardiovascular: Normal rate.  Pulmonary/Chest: Effort normal. No respiratory distress.  Abdominal: Soft. He exhibits no distension.  Musculoskeletal: Normal range of motion. He exhibits no edema.  Mildly antalgic gait.  Tenderness around the first MTP joint.  No noticeable swelling.  No erythema or warmth.  Pain with any toe movement.  X-ray negative  Neurological: He is alert.  Skin: Skin is warm and dry.  Vitals reviewed.    UC Treatments / Results  Labs (all labs ordered are listed, but only  abnormal results are displayed) Labs Reviewed - No data to display  EKG None  Radiology Dg Foot Complete Right  Result Date: 05/12/2018 CLINICAL DATA:  Onset of right foot pain and swelling last night centered over the great toe and first MTP joint. No known injury. EXAM: RIGHT FOOT COMPLETE - 3+ VIEW COMPARISON:  None. FINDINGS: The bones are subjectively adequately mineralized. There is no acute or healing fracture. There is no lytic or blastic lesion. No significant arthropathic changes are observed. Specific attention to the great toe and the first MTP joint reveal no acute abnormalities. No soft tissue calcifications are observed and no significant soft tissue swelling is demonstrated. IMPRESSION: There is no acute or significant chronic bony or soft tissue abnormality of the right foot. Specific attention to the first ray reveals no acute abnormality. Electronically Signed   By: David  Martinique M.D.   On: 05/12/2018 12:17    Procedures Procedures (including critical care time)  Medications Ordered in UC Medications - No data to display  Initial Impression / Assessment and Plan / UC Course  I have reviewed the triage vital signs and the nursing notes.  Pertinent labs & imaging results that were available during my care of the patient were reviewed by me and considered in my medical decision making (see chart for details).     *Study had an arthralgia of the joint.  Does not appear to be tendon.  No bony structural changes.  He could have early arthritis it does not show on x-ray.  It could be gout. Final Clinical Impressions(s) / UC Diagnoses   Final diagnoses:  Foot pain, right  Arthralgia of right foot     Discharge Instructions     You have inflammation of the toe joint.  This could be caused by gout, or prolonged walking. Take the prednisone for 2 days. This is a strong anti-inflammatory After the prednisone take ibuprofen 3 times a day with food Your x-ray was  negative See your family doctor if the pain persist    ED Prescriptions    Medication Sig Dispense Auth. Provider   predniSONE (DELTASONE) 20 MG tablet Take 3 pills today and tomorrow 6 tablet Raylene Everts, MD   ibuprofen (ADVIL,MOTRIN) 800 MG tablet Take 1 tablet (800 mg total) by mouth 3 (three) times daily. 21 tablet Raylene Everts, MD     Controlled Substance Prescriptions [place] Controlled Substance Registry consulted? Not Applicable   Raylene Everts, MD 05/12/18 1339

## 2018-05-12 NOTE — Discharge Instructions (Addendum)
You have inflammation of the toe joint.  This could be caused by gout, or prolonged walking. Take the prednisone for 2 days. This is a strong anti-inflammatory After the prednisone take ibuprofen 3 times a day with food Your x-ray was negative See your family doctor if the pain persist

## 2018-05-12 NOTE — ED Triage Notes (Signed)
Pt sts right foot pain with some swelling; pt denies obvious injury

## 2019-11-03 DIAGNOSIS — L03012 Cellulitis of left finger: Secondary | ICD-10-CM | POA: Insufficient documentation

## 2019-11-03 DIAGNOSIS — Z87891 Personal history of nicotine dependence: Secondary | ICD-10-CM | POA: Insufficient documentation

## 2019-11-03 DIAGNOSIS — Z79899 Other long term (current) drug therapy: Secondary | ICD-10-CM | POA: Diagnosis not present

## 2019-11-03 DIAGNOSIS — M79645 Pain in left finger(s): Secondary | ICD-10-CM | POA: Diagnosis present

## 2019-11-03 DIAGNOSIS — Z9660 Presence of unspecified orthopedic joint implant: Secondary | ICD-10-CM | POA: Insufficient documentation

## 2019-11-04 ENCOUNTER — Encounter (HOSPITAL_COMMUNITY): Payer: Self-pay

## 2019-11-04 ENCOUNTER — Emergency Department (HOSPITAL_COMMUNITY)
Admission: EM | Admit: 2019-11-04 | Discharge: 2019-11-04 | Disposition: A | Discharge status: Home / Self Care | Payer: No Typology Code available for payment source | Attending: Emergency Medicine | Admitting: Emergency Medicine

## 2019-11-04 ENCOUNTER — Other Ambulatory Visit: Payer: Self-pay

## 2019-11-04 DIAGNOSIS — L03012 Cellulitis of left finger: Secondary | ICD-10-CM

## 2019-11-04 MED ORDER — DOXYCYCLINE HYCLATE 100 MG PO TABS
100.0000 mg | ORAL_TABLET | Freq: Once | ORAL | Status: AC
  Administered 2019-11-04: 100 mg via ORAL
  Filled 2019-11-04: qty 1

## 2019-11-04 MED ORDER — DOXYCYCLINE HYCLATE 100 MG PO CAPS: 100.0000 mg | ORAL_CAPSULE | Freq: Two times a day (BID) | ORAL | 0 refills | Status: AC

## 2019-11-04 MED ORDER — CEPHALEXIN 500 MG PO CAPS: 500.0000 mg | ORAL_CAPSULE | Freq: Four times a day (QID) | ORAL | 0 refills | Status: AC

## 2019-11-04 MED ORDER — CEPHALEXIN 500 MG PO CAPS
500.0000 mg | ORAL_CAPSULE | Freq: Once | ORAL | Status: AC
  Administered 2019-11-04: 500 mg via ORAL
  Filled 2019-11-04: qty 1

## 2019-11-04 NOTE — ED Triage Notes (Signed)
Tooth pick to left 2nd digit last Friday.

## 2019-11-04 NOTE — ED Provider Notes (Signed)
[facility] DEPT Provider Note   CSN: [id number] Arrival date & time: 11/03/19  2351     History Chief Complaint  Patient presents with  . Finger Injury    Lawrence Ford is a 59 y.o. male with a past medical history of prostate cancer who presents today for evaluation of a left second finger infection.  He reports that he was reaching into a person and poked his left second finger on a toothpick on Friday.  Since then he has had worsening pain and swelling.  He presents today as he has a swelling on the side of the nail.  He denies any fevers.  No interventions tried prior to arrival.  He denies any other trauma.  HPI     Past Medical History:  Diagnosis Date  . Cancer [facility])    prostate    Patient Active Problem List   Diagnosis Date Noted  . Encounter for screening colonoscopy   . Benign neoplasm of transverse colon     Past Surgical History:  Procedure Laterality Date  . COLONOSCOPY WITH PROPOFOL N/A 04/07/2018   Procedure: COLONOSCOPY WITH PROPOFOL;  Surgeon: Lucilla Lame, MD;  Location: [facility] ENDOSCOPY;  Service: Endoscopy;  Laterality: N/A;  . JOINT REPLACEMENT    . KNEE SURGERY    . PROSTATECTOMY         Family History  Problem Relation Age of Onset  . Hypertension Father     Social History   Tobacco Use  . Smoking status: Former Research scientist (life sciences)  . Smokeless tobacco: Never Used  Substance Use Topics  . Alcohol use: No  . Drug use: No    Home Medications Prior to Admission medications   Medication Sig Start Date End Date Taking? Authorizing Provider  atorvastatin (LIPITOR) 40 MG tablet Take 20 mg by mouth daily.     [provider]  cephALEXin (KEFLEX) 500 MG capsule Take 1 capsule (500 mg total) by mouth 4 (four) times daily. 11/04/19   Lorin Glass, PA-C  doxycycline (VIBRAMYCIN) 100 MG capsule Take 1 capsule (100 mg total) by mouth 2 (two) times daily. 11/04/19   Lorin Glass, PA-C  ibuprofen  (ADVIL,MOTRIN) 800 MG tablet Take 1 tablet (800 mg total) by mouth 3 (three) times daily. 05/12/18   Raylene Everts, MD  predniSONE (DELTASONE) 20 MG tablet Take 3 pills today and tomorrow 05/12/18   Raylene Everts, MD  ranitidine (ZANTAC) 150 MG tablet Take 150 mg by mouth every evening.    [provider]    Allergies    No known allergies  Review of Systems   Review of Systems  Constitutional: Negative for chills and fever.  Skin:       Swelling and finger pain in the left second finger.   All other systems reviewed and are negative.   Physical Exam Updated Vital Signs BP (!) 165/84 (BP Location: Right Arm)   Pulse 70   Temp 98.1 F (36.7 C) (Oral)   Resp 17   Ht 5\' 7"  (1.702 m)   Wt 88.5 kg   SpO2 100%   BMI 30.54 kg/m   Physical Exam Vitals and nursing note reviewed.  Constitutional:      General: He is not in acute distress. HENT:     Head: Normocephalic and atraumatic.  Musculoskeletal:     Comments: There is full AROM of the left second finger.    Skin:    Comments: There is edema and a  purulent appearing fluid collection on the left second finger on the radial aspect of the finger.  The finger is mildly swollen.   Neurological:     General: No focal deficit present.     Mental Status: He is alert.  Psychiatric:        Mood and Affect: Mood normal.        Behavior: Behavior normal.       ED Results / Procedures / Treatments   Labs (all labs ordered are listed, but only abnormal results are displayed) Labs Reviewed - No data to display  EKG None  Radiology No results found.  Procedures .Marland KitchenIncision and Drainage  Date/Time: 11/04/2019 7:26 AM Performed by: Lorin Glass, PA-C Authorized by: Lorin Glass, PA-C   Consent:    Consent obtained:  Verbal   Consent given by:  Patient   Risks discussed:  Bleeding, damage to other organs, incomplete drainage, infection and pain (Retained foreign body, need for additional  procedures. )   Alternatives discussed:  Alternative treatment and no treatment Location:    Indications for incision and drainage: Paronychia.   Location: Left second finger, radial aspect. Pre-procedure details:    Skin preparation:  Chloraprep Anesthesia (see MAR for exact dosages):    Anesthesia method:  Topical application   Topical anesthesia: Cold spray. Procedure type:    Complexity:  Simple Procedure details:    Incision types:  Stab incision   Incision depth:  Subcutaneous   Scalpel blade:  11   Drainage:  Purulent and bloody   Drainage amount:  Moderate   Packing materials:  None Post-procedure details:    Patient tolerance of procedure:  Tolerated well, no immediate complications   (including critical care time)  Medications Ordered in ED Medications  doxycycline (VIBRA-TABS) tablet 100 mg (100 mg Oral Given 11/04/19 0213)  cephALEXin (KEFLEX) capsule 500 mg (500 mg Oral Given 11/04/19 0213)    ED Course  I have reviewed the triage vital signs and the nursing notes.  Pertinent labs & imaging results that were available during my care of the patient were reviewed by me and considered in my medical decision making (see chart for details).    MDM Rules/Calculators/A&P                     Patient is a 59 year old man who presents today for evaluation of a paronychia after he poked his finger with a toothpick.  On exam he has a area of purulence on the second finger.  I discussed with patient multiple treatment options.  Recommended x-rays to evaluate for radiopaque retained foreign bodies and we discussed the limitations of x-rays, patient made the informed decision to decline x-rays.  Additionally we discussed options for anesthesia.  I discussed role of digital block to allow for more extensive debridement.  Patient after this still elected for topical application of cold spray and localize incision and drainage.  We discussed risks of this including retained foreign body,  need for additional procedures, worsening infection, spread of infection and multiple other complications due to inability to thoroughly explore the area especially as not getting x-rays.  Patient refused this still elected for cold spray with incision.    I&D was performed with bloody and purulent drainage.  The edge of the nail was elevated to allow for full drainage and removal of the dark streak that is seen in the clinical image.   Patient will be placed on doxycycline and Keflex.  He is given his first dose while in the emergency room.  His tetanus is up-to-date.  He is generally well-appearing.  OTC pain medication.  Recommended close outpatient follow-up, especially given the possibility of retained foreign bodies.  Return precautions were discussed with patient who states their understanding.  At the time of discharge patient denied any unaddressed complaints or concerns.  Patient is agreeable for discharge home.  Note: Portions of this report may have been transcribed using voice recognition software. Every effort was made to ensure accuracy; however, inadvertent computerized transcription errors may be present   Final Clinical Impression(s) / ED Diagnoses Final diagnoses:  Paronychia of finger of left hand    Rx / DC Orders ED Discharge Orders         Ordered    doxycycline (VIBRAMYCIN) 100 MG capsule  2 times daily     11/04/19 0203    cephALEXin (KEFLEX) 500 MG capsule  4 times daily     11/04/19 0203           Lorin Glass, PA-C 11/04/19 0739    Palumbo, April, MD 11/04/19 2324

## 2019-11-04 NOTE — Discharge Instructions (Addendum)
Please perform warm water soaks starting tomorrow and put antibiotic ointment on the area.  Warm water soaks in clean water three times a day.   Please take Ibuprofen (Advil, motrin) and Tylenol (acetaminophen) to relieve your pain.  You may take up to 600 MG (3 pills) of normal strength ibuprofen every 8 hours as needed.  In between doses of ibuprofen you make take tylenol, up to 1,000 mg (two extra strength pills).  Do not take more than 3,000 mg tylenol in a 24 hour period.  Please check all medication labels as many medications such as pain and cold medications may contain tylenol.  Do not drink alcohol while taking these medications.  Do not take other NSAID'S while taking ibuprofen (such as aleve or naproxen).  Please take ibuprofen with food to decrease stomach upset.  You may have diarrhea from the antibiotics.  It is very important that you continue to take the antibiotics even if you get diarrhea unless a medical professional tells you that you may stop taking them.  If you stop too early the bacteria you are being treated for will become stronger and you may need different, more powerful antibiotics that have more side effects and worsening diarrhea.  Please stay well hydrated and consider probiotics as they may decrease the severity of your diarrhea.

## 2020-02-14 ENCOUNTER — Other Ambulatory Visit: Payer: Self-pay

## 2020-02-14 DIAGNOSIS — Z20822 Contact with and (suspected) exposure to covid-19: Secondary | ICD-10-CM

## 2020-02-15 LAB — SARS-COV-2, NAA 2 DAY TAT

## 2020-02-15 LAB — NOVEL CORONAVIRUS, NAA: SARS-CoV-2, NAA: NOT DETECTED

## 2020-02-21 ENCOUNTER — Other Ambulatory Visit

## 2021-08-24 ENCOUNTER — Encounter (HOSPITAL_BASED_OUTPATIENT_CLINIC_OR_DEPARTMENT_OTHER): Payer: Self-pay | Admitting: *Deleted

## 2021-08-24 ENCOUNTER — Emergency Department (HOSPITAL_BASED_OUTPATIENT_CLINIC_OR_DEPARTMENT_OTHER)
Admission: EM | Admit: 2021-08-24 | Discharge: 2021-08-24 | Disposition: A | Discharge status: Home / Self Care | Payer: No Typology Code available for payment source | Attending: Emergency Medicine | Admitting: Emergency Medicine

## 2021-08-24 ENCOUNTER — Emergency Department (HOSPITAL_BASED_OUTPATIENT_CLINIC_OR_DEPARTMENT_OTHER): Payer: No Typology Code available for payment source

## 2021-08-24 ENCOUNTER — Other Ambulatory Visit: Payer: Self-pay

## 2021-08-24 DIAGNOSIS — M25532 Pain in left wrist: Secondary | ICD-10-CM | POA: Insufficient documentation

## 2021-08-24 MED ORDER — IBUPROFEN 800 MG PO TABS: 800.0000 mg | ORAL_TABLET | Freq: Three times a day (TID) | ORAL | 0 refills | Status: AC

## 2021-08-24 NOTE — ED Triage Notes (Signed)
Pt is here for left wrist pain without any known trauma that began yesterday but is worse today.

## 2021-08-24 NOTE — Discharge Instructions (Addendum)
Please use Tylenol or ibuprofen for pain.  You may use 600 mg ibuprofen every 6 hours or 1000 mg of Tylenol every 6 hours.  You may choose to alternate between the 2.  This would be most effective.  Not to exceed 4 g of Tylenol within 24 hours.  Not to exceed 3200 mg ibuprofen 24 hours.  Recommend you apply ice to the affected area, wear this wrist brace as tolerated, especially at night.  If you have continued pain despite treatment in 1 to 2 weeks I recommend you follow-up with orthopedics.

## 2021-08-24 NOTE — ED Provider Notes (Signed)
[facility] EMERGENCY DEPT Provider Note   CSN: [id number] Arrival date & time: 08/24/21  1112     History  Chief Complaint  Patient presents with   Wrist Pain    Lawrence Ford is a 61 y.o. male with no segment past medical history presents with sudden onset left wrist pain located behind his left thumb.  Patient denies any specific injury, but does work with his hands as an Programmer, applications, as well as works out regularly so may have sprained or strained it.  Patient reports occasional tingling of the third fourth and fifth finger without persistent numbness, no numbness or tingling present at time my examination.  Patient reports he took naproxen for pain this morning with significant improvement but no resolution.   Wrist Pain      Home Medications Prior to Admission medications   Medication Sig Start Date End Date Taking? Authorizing Provider  ibuprofen (ADVIL) 800 MG tablet Take 1 tablet (800 mg total) by mouth 3 (three) times daily. 08/24/21  Yes Keniesha Adderly H, PA-C  atorvastatin (LIPITOR) 40 MG tablet Take 20 mg by mouth daily.     [provider]  cephALEXin (KEFLEX) 500 MG capsule Take 1 capsule (500 mg total) by mouth 4 (four) times daily. 11/04/19   Lorin Glass, PA-C  doxycycline (VIBRAMYCIN) 100 MG capsule Take 1 capsule (100 mg total) by mouth 2 (two) times daily. 11/04/19   Lorin Glass, PA-C  predniSONE (DELTASONE) 20 MG tablet Take 3 pills today and tomorrow 05/12/18   Raylene Everts, MD  ranitidine (ZANTAC) 150 MG tablet Take 150 mg by mouth every evening.    [provider]      Allergies    No known allergies    Review of Systems   Review of Systems  Musculoskeletal:  Positive for arthralgias.  All other systems reviewed and are negative.  Physical Exam Updated Vital Signs BP 124/77    Pulse 74    Temp 97.6 F (36.4 C) (Oral)    Resp 16    Wt 88.5 kg    SpO2 100%    BMI 30.54 kg/m  Physical  Exam Vitals and nursing note reviewed.  Constitutional:      General: He is not in acute distress.    Appearance: Normal appearance.  HENT:     Head: Normocephalic and atraumatic.  Eyes:     General:        Right eye: No discharge.        Left eye: No discharge.  Cardiovascular:     Rate and Rhythm: Normal rate and regular rhythm.     Comments: Radial, ulnar pulses intact bilaterally Pulmonary:     Effort: Pulmonary effort is normal. No respiratory distress.  Musculoskeletal:        General: No deformity.     Comments: Patient with tenderness to palpation of the left wrist proximal to the thumb metacarpal.  No swelling or deformity noted.  No redness, or irritation of the skin.  Intact strength 5 out of 5 to flexion extension of all digits.  Some additional pain with Wynn Maudlin.  Skin:    General: Skin is warm and dry.     Capillary Refill: Capillary refill takes less than 2 seconds.  Neurological:     Mental Status: He is alert and oriented to person, place, and time.  Psychiatric:        Mood and Affect: Mood normal.  Behavior: Behavior normal.    ED Results / Procedures / Treatments   Labs (all labs ordered are listed, but only abnormal results are displayed) Labs Reviewed - No data to display  EKG None  Radiology DG Wrist Complete Left  Result Date: 08/24/2021 CLINICAL DATA:  Wrist pain EXAM: LEFT WRIST - COMPLETE 3+ VIEW COMPARISON:  None. FINDINGS: There is no evidence of acute fracture or dislocation. There is minimal radiocarpal and base of thumb degenerative change. Vascular calcifications. IMPRESSION: No acute osseous abnormality. Electronically Signed   By: Maurine Simmering M.D.   On: 08/24/2021 11:55    Procedures Procedures    Medications Ordered in ED Medications - No data to display  ED Course/ Medical Decision Making/ A&P                           Medical Decision Making Amount and/or Complexity of Data Reviewed Radiology: ordered.   Is an  overall well-appearing patient with left wrist pain for 1 day.  No emergent differential diagnosis includes acute gout, pseudogout flare, septic arthritis, fracture, dislocation.  On physical exam he has intact strength, range of motion, no signs of infection on the skin surface.  He has some additional pain with Finkelstein's, and tenderness to palpation of the wrist specifically proximal to the thumb metacarpal.  Overall do not believe clinically that he has acute de Quervain's tenosynovitis, however there may be some involvement of the same ligaments and what is likely a sprain injury.  No evidence of cubital tunnel syndrome, no replication of tingling third fourth and fifth digit with palpation of cubital tunnel.  I independently ordered and interpreted radiographic imaging of the left wrist shows no acute osseous abnormality.  I agree with the radiologist interpretation.  Encouraged wrist brace, ice, ibuprofen, Tylenol.  Encourage follow-up with orthopedics if no improvement in 1 to 2 weeks.  Patient discharged in stable condition at this time, return precautions given. Final Clinical Impression(s) / ED Diagnoses Final diagnoses:  Left wrist pain    Rx / DC Orders ED Discharge Orders          Ordered    ibuprofen (ADVIL) 800 MG tablet  3 times daily        08/24/21 1217              Traquan Duarte, Jackalyn Lombard 08/24/21 1223    Blanchie Dessert, MD 08/25/21 1432

## 2021-08-24 NOTE — ED Notes (Signed)
Dc instructions and scripts reviewed with pt no questions or concerns at thistime. Will follow up with ortho as needed

## 2022-01-06 ENCOUNTER — Other Ambulatory Visit: Payer: Self-pay

## 2022-01-06 ENCOUNTER — Emergency Department (HOSPITAL_BASED_OUTPATIENT_CLINIC_OR_DEPARTMENT_OTHER)

## 2022-01-06 ENCOUNTER — Emergency Department (HOSPITAL_BASED_OUTPATIENT_CLINIC_OR_DEPARTMENT_OTHER)
Admission: EM | Admit: 2022-01-06 | Discharge: 2022-01-06 | Disposition: A | Discharge status: Home / Self Care | Attending: Emergency Medicine | Admitting: Emergency Medicine

## 2022-01-06 ENCOUNTER — Encounter (HOSPITAL_BASED_OUTPATIENT_CLINIC_OR_DEPARTMENT_OTHER): Payer: Self-pay

## 2022-01-06 DIAGNOSIS — X58XXXA Exposure to other specified factors, initial encounter: Secondary | ICD-10-CM | POA: Insufficient documentation

## 2022-01-06 DIAGNOSIS — S8992XA Unspecified injury of left lower leg, initial encounter: Secondary | ICD-10-CM | POA: Diagnosis present

## 2022-01-06 DIAGNOSIS — S8390XA Sprain of unspecified site of unspecified knee, initial encounter: Secondary | ICD-10-CM

## 2022-01-06 DIAGNOSIS — S8392XA Sprain of unspecified site of left knee, initial encounter: Secondary | ICD-10-CM | POA: Diagnosis not present

## 2022-01-06 NOTE — ED Provider Notes (Signed)
[facility] EMERGENCY DEPT Provider Note   CSN: [id number] Arrival date & time: 01/06/22  1912     History  Chief Complaint  Patient presents with   Knee Pain    Lawrence Ford is a 61 y.o. male.   Knee Pain Patient presents with left knee pain.  Behind left knee.  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  States he was standing up and felt pain laterally and posteriorly.  No other injury.  Has been using a crutch to ambulate.  No blood thinners.     Home Medications Prior to Admission medications   Medication Sig Start Date End Date Taking? Authorizing Provider  atorvastatin (LIPITOR) 40 MG tablet Take 20 mg by mouth daily.     [provider]  cephALEXin (KEFLEX) 500 MG capsule Take 1 capsule (500 mg total) by mouth 4 (four) times daily. 11/04/19   Lorin Glass, PA-C  doxycycline (VIBRAMYCIN) 100 MG capsule Take 1 capsule (100 mg total) by mouth 2 (two) times daily. 11/04/19   Lorin Glass, PA-C  ibuprofen (ADVIL) 800 MG tablet Take 1 tablet (800 mg total) by mouth 3 (three) times daily. 08/24/21   Prosperi, Christian H, PA-C  predniSONE (DELTASONE) 20 MG tablet Take 3 pills today and tomorrow 05/12/18   Raylene Everts, MD  ranitidine (ZANTAC) 150 MG tablet Take 150 mg by mouth every evening.    [provider]      Allergies    No known allergies    Review of Systems   Review of Systems  Physical Exam Updated Vital Signs BP (!) 144/94 (BP Location: Right Arm)   Pulse 79   Temp 98.4 F (36.9 C)   Resp 16   Ht '5\' 7"'$  (1.702 m)   Wt 88.5 kg   SpO2 96%   BMI 30.56 kg/m  Physical Exam Vitals and nursing note reviewed.  Musculoskeletal:        General: Tenderness present.     Comments: Some tenderness behind the left knee vertically on proximal calf on the left.  Knee is stable but does have an effusion.  Anterior scars on both knees from previous replacement.  Neurovascular intact distally.  Good range of motion.  Some  pain with valgus strain.  Neurological:     Mental Status: He is alert.     ED Results / Procedures / Treatments   Labs (all labs ordered are listed, but only abnormal results are displayed) Labs Reviewed - No data to display  EKG None  Radiology DG Knee Complete 4 Views Left  Result Date: 01/06/2022 CLINICAL DATA:  Left lateral knee pain, no specific injury EXAM: LEFT KNEE - COMPLETE 4+ VIEW COMPARISON:  None Available. FINDINGS: Status post left knee total arthroplasty. No evidence of perihardware fracture or component malpositioning. Large, nonspecific knee joint effusion. Soft tissue edema anteriorly. IMPRESSION: 1. Status post left knee total arthroplasty. No evidence of perihardware fracture or component malpositioning. 2. Large, nonspecific knee joint effusion. Soft tissue edema anteriorly. Electronically Signed   By: Delanna Ahmadi M.D.   On: 01/06/2022 19:41    Procedures Procedures    Medications Ordered in ED Medications - No data to display  ED Course/ Medical Decision Making/ A&P                           Medical Decision Making Amount and/or Complexity of Data Reviewed Radiology: ordered.   Patient with cute onset of  left knee pain.  Tenderness is over proximal calf and a little bit over the medial knee.  Does have a knee effusion.  Good range of motion.  Doubt infection.  Knee x-ray independently interpreted and showed no definite fracture and hardware appears intact, however does have effusion.  Knee immobilizer offered but will do Ace wrap since he refused.  Has crutches.  Has orthopedic surgeon with [facility].  We will follow-up as an outpatient as needed.  No fracture seen.        Final Clinical Impression(s) / ED Diagnoses Final diagnoses:  Sprain of knee, unspecified laterality, unspecified ligament, initial encounter    Rx / DC Orders ED Discharge Orders     None         Davonna Belling, MD 01/06/22 2003

## 2022-01-06 NOTE — ED Notes (Signed)
Pt refused knee immobilizer. MD aware. Applied ace wrap to left knee.

## 2022-01-06 NOTE — ED Notes (Signed)
Pt verbalizes understanding of discharge instructions. Opportunity for questioning and answers were provided. Pt discharged from ED to home.   ? ?

## 2022-01-06 NOTE — ED Triage Notes (Signed)
Patient here POV from Home.  Endorses Left Lateral Knee Pain that began yesterday PM. Patient was sitting and arose to Standing Position when Pain began.  Pulses Palpable Distally. No Acute Trauma or Injury. No Anticoagulants.   NAD Noted during Triage. A&Ox4. GCS 15. Ambulatory with C[facility].

## 2023-02-26 ENCOUNTER — Telehealth: Payer: Self-pay

## 2023-02-26 ENCOUNTER — Other Ambulatory Visit: Payer: Self-pay

## 2023-02-26 ENCOUNTER — Emergency Department (HOSPITAL_BASED_OUTPATIENT_CLINIC_OR_DEPARTMENT_OTHER)
Admission: EM | Admit: 2023-02-26 | Discharge: 2023-02-26 | Disposition: A | Discharge status: Home / Self Care | Payer: No Typology Code available for payment source | Attending: Emergency Medicine | Admitting: Emergency Medicine

## 2023-02-26 ENCOUNTER — Encounter (HOSPITAL_BASED_OUTPATIENT_CLINIC_OR_DEPARTMENT_OTHER): Payer: Self-pay

## 2023-02-26 DIAGNOSIS — Z86010 Personal history of colonic polyps: Secondary | ICD-10-CM

## 2023-02-26 DIAGNOSIS — Z8546 Personal history of malignant neoplasm of prostate: Secondary | ICD-10-CM | POA: Insufficient documentation

## 2023-02-26 DIAGNOSIS — Y92410 Unspecified street and highway as the place of occurrence of the external cause: Secondary | ICD-10-CM | POA: Insufficient documentation

## 2023-02-26 DIAGNOSIS — M549 Dorsalgia, unspecified: Secondary | ICD-10-CM | POA: Insufficient documentation

## 2023-02-26 MED ORDER — GOLYTELY 236 G PO SOLR: 4000.0000 mL | Freq: Once | ORAL | 0 refills | Status: AC

## 2023-02-26 MED ORDER — CYCLOBENZAPRINE HCL 10 MG PO TABS: 10.0000 mg | ORAL_TABLET | Freq: Two times a day (BID) | ORAL | 0 refills | Status: AC | PRN
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
## 2023-02-26 NOTE — Telephone Encounter (Signed)
Pt requested call back to schedule a five year follow-up for colonoscopy

## 2023-02-26 NOTE — Telephone Encounter (Signed)
Gastroenterology Pre-Procedure Review  Request Date: 04/24/23/ Requesting Physician: Dr. Servando Snare  PATIENT REVIEW QUESTIONS: The patient responded to the following health history questions as indicated:    1. Are you having any GI issues? no 2. Do you have a personal history of Polyps? yes (last colonoscopy was with Dr Servando Snare 04/12/18) 3. Do you have a family history of Colon Cancer or Polyps? no 4. Diabetes Mellitus? no 5. Joint replacements in the past 12 months?no 6. Major health problems in the past 3 months?no 7. Any artificial heart valves, MVP, or defibrillator?no    MEDICATIONS & ALLERGIES:    Patient reports the following regarding taking any anticoagulation/antiplatelet therapy:   Plavix, Coumadin, Eliquis, Xarelto, Lovenox, Pradaxa, Brilinta, or Effient? no Aspirin? no  Patient confirms/reports the following medications:  Current Outpatient Medications  Medication Sig Dispense Refill   atorvastatin (LIPITOR) 40 MG tablet Take 20 mg by mouth daily.      ibuprofen (ADVIL) 800 MG tablet Take 1 tablet (800 mg total) by mouth 3 (three) times daily. 21 tablet 0   ranitidine (ZANTAC) 150 MG tablet Take 150 mg by mouth every evening.     cephALEXin (KEFLEX) 500 MG capsule Take 1 capsule (500 mg total) by mouth 4 (four) times daily. (Patient not taking: Reported on 02/26/2023) 40 capsule 0   doxycycline (VIBRAMYCIN) 100 MG capsule Take 1 capsule (100 mg total) by mouth 2 (two) times daily. (Patient not taking: Reported on 02/26/2023) 20 capsule 0   predniSONE (DELTASONE) 20 MG tablet Take 3 pills today and tomorrow (Patient not taking: Reported on 02/26/2023) 6 tablet 0   No current facility-administered medications for this visit.    Patient confirms/reports the following allergies:  Allergies  Allergen Reactions   No Known Allergies     No orders of the defined types were placed in this encounter.   AUTHORIZATION INFORMATION Primary Insurance: 1D#: Group #:  Secondary  Insurance: 1D#: Group #:  SCHEDULE INFORMATION: Date: 04/24/23 Time: Location: [facility]

## 2023-02-26 NOTE — Discharge Instructions (Addendum)
Please take your medications as prescribed. Take tylenol/ibuprofen for pain.  You can also use heat packs or ice compresses for symptom relief.  I recommend close follow-up with PCP for reevaluation.  Please do not hesitate to return to emergency department if worrisome signs symptoms we discussed become apparent.

## 2023-04-23 ENCOUNTER — Telehealth: Payer: Self-pay

## 2023-04-23 NOTE — Telephone Encounter (Signed)
Patient called in wanting to get the time for his procedure and I gave him the number for end. I informed him to call them between 1-3 today.

## 2023-04-24 ENCOUNTER — Ambulatory Visit: Admitting: Certified Registered"

## 2023-04-24 ENCOUNTER — Encounter: Payer: Self-pay | Admitting: Gastroenterology

## 2023-04-24 ENCOUNTER — Encounter
Admission: RE | Disposition: A | Discharge status: Home / Self Care | Payer: Self-pay | Source: Home / Self Care | Attending: Gastroenterology

## 2023-04-24 ENCOUNTER — Ambulatory Visit
Admission: RE | Admit: 2023-04-24 | Discharge: 2023-04-24 | Disposition: A | Discharge status: Home / Self Care | Attending: Gastroenterology | Admitting: Gastroenterology

## 2023-04-24 DIAGNOSIS — Z87891 Personal history of nicotine dependence: Secondary | ICD-10-CM | POA: Diagnosis not present

## 2023-04-24 DIAGNOSIS — Z1211 Encounter for screening for malignant neoplasm of colon: Secondary | ICD-10-CM | POA: Insufficient documentation

## 2023-04-24 DIAGNOSIS — K635 Polyp of colon: Secondary | ICD-10-CM | POA: Diagnosis not present

## 2023-04-24 DIAGNOSIS — K640 First degree hemorrhoids: Secondary | ICD-10-CM | POA: Insufficient documentation

## 2023-04-24 DIAGNOSIS — Z860101 Personal history of adenomatous and serrated colon polyps: Secondary | ICD-10-CM | POA: Diagnosis not present

## 2023-04-24 DIAGNOSIS — Z860100 Personal history of colon polyps, unspecified: Secondary | ICD-10-CM

## 2023-04-24 HISTORY — PX: POLYPECTOMY: SHX5525

## 2023-04-24 HISTORY — PX: COLONOSCOPY WITH PROPOFOL: SHX5780

## 2023-04-24 SURGERY — COLONOSCOPY WITH PROPOFOL
Anesthesia: General

## 2023-04-24 MED ORDER — DEXMEDETOMIDINE HCL IN NACL 200 MCG/50ML IV SOLN
INTRAVENOUS | Status: DC | PRN
Start: 2023-04-24 — End: 2023-04-24
  Administered 2023-04-24: 8 ug via INTRAVENOUS

## 2023-04-24 MED ORDER — LIDOCAINE HCL (CARDIAC) PF 100 MG/5ML IV SOSY
PREFILLED_SYRINGE | INTRAVENOUS | Status: DC | PRN
  Administered 2023-04-24: 100 mg via INTRAVENOUS

## 2023-04-24 MED ORDER — PROPOFOL 10 MG/ML IV BOLUS
INTRAVENOUS | Status: DC | PRN
  Administered 2023-04-24: 100 mg via INTRAVENOUS
  Administered 2023-04-24: 50 mg via INTRAVENOUS
  Administered 2023-04-24: 30 mg via INTRAVENOUS

## 2023-04-24 MED ORDER — PROPOFOL 500 MG/50ML IV EMUL
INTRAVENOUS | Status: DC | PRN
  Administered 2023-04-24: 165 ug/kg/min via INTRAVENOUS

## 2023-04-24 MED ORDER — GLYCOPYRROLATE 0.2 MG/ML IJ SOLN
INTRAMUSCULAR | Status: DC | PRN
  Administered 2023-04-24: .2 mg via INTRAVENOUS

## 2023-04-24 MED ORDER — SODIUM CHLORIDE 0.9 % IV SOLN: INTRAVENOUS | Status: DC

## 2023-04-24 NOTE — Plan of Care (Signed)
[facility] Tonsillectomy/Adenoidectomy, Postoperative PEDS care plan entered in error.

## 2023-04-24 NOTE — Transfer of Care (Signed)
Immediate Anesthesia Transfer of Care Note  Patient: Lawrence Ford  Procedure(s) Performed: COLONOSCOPY WITH PROPOFOL POLYPECTOMY  Patient Location: Endoscopy Unit  Anesthesia Type:General  Level of Consciousness: drowsy and patient cooperative  Airway & Oxygen Therapy: Patient Spontanous Breathing and Patient connected to face mask oxygen  Post-op Assessment: Report given to RN and Post -op Vital signs reviewed and stable  Post vital signs: Reviewed and stable  Last Vitals:  Vitals Value Taken Time  BP 114/74 04/24/23 0836  Temp 36.4 C 04/24/23 0835  Pulse 78 04/24/23 0837  Resp    SpO2 100 % 04/24/23 0837  Vitals shown include unfiled device data.  Last Pain:  Vitals:   04/24/23 0835  TempSrc: Temporal  PainSc: Asleep         Complications: No notable events documented.

## 2023-04-24 NOTE — H&P (Signed)
Midge Minium, MD [facility] [address], [address] [place], [place] [address] [phone] Fax : [phone]  Primary Care Physician:  Tiana Loft, MD Primary Gastroenterologist:  Dr. Servando Snare  Pre-Procedure History & Physical: HPI:  Lawrence Ford is a 62 y.o. male is here for an colonoscopy.   Past Medical History:  Diagnosis Date   Cancer [facility])    prostate    Past Surgical History:  Procedure Laterality Date   COLONOSCOPY WITH PROPOFOL N/A 04/07/2018   Procedure: COLONOSCOPY WITH PROPOFOL;  Surgeon: Midge Minium, MD;  Location: [facility] ENDOSCOPY;  Service: Endoscopy;  Laterality: N/A;   JOINT REPLACEMENT     KNEE SURGERY     PROSTATECTOMY      Prior to Admission medications   Medication Sig Start Date End Date Taking? Authorizing Provider  atorvastatin (LIPITOR) 40 MG tablet Take 20 mg by mouth daily.  Patient not taking: Reported on 02/26/2023    [provider]  cephALEXin (KEFLEX) 500 MG capsule Take 1 capsule (500 mg total) by mouth 4 (four) times daily. Patient not taking: Reported on 02/26/2023 11/04/19   Cristina Gong, PA-C  cyclobenzaprine (FLEXERIL) 10 MG tablet Take 1 tablet (10 mg total) by mouth 2 (two) times daily as needed for muscle spasms. 02/26/23   Jeanelle Malling, PA  doxycycline (VIBRAMYCIN) 100 MG capsule Take 1 capsule (100 mg total) by mouth 2 (two) times daily. Patient not taking: Reported on 02/26/2023 11/04/19   Cristina Gong, PA-C  ibuprofen (ADVIL) 800 MG tablet Take 1 tablet (800 mg total) by mouth 3 (three) times daily. 08/24/21   Prosperi, Christian H, PA-C  predniSONE (DELTASONE) 20 MG tablet Take 3 pills today and tomorrow Patient not taking: Reported on 02/26/2023 05/12/18   Eustace Moore, MD  ranitidine (ZANTAC) 150 MG tablet Take 150 mg by mouth every evening. Patient not taking: Reported on 02/26/2023    [provider]    Allergies as of 02/27/2023 - Review Complete 02/26/2023  Allergen Reaction Noted   No known  allergies  11/04/2019    Family History  Problem Relation Age of Onset   Hypertension Father     Social History   Socioeconomic History   Marital status: Married    Spouse name: Not on file   Number of children: Not on file   Years of education: Not on file   Highest education level: Not on file  Occupational History   Not on file  Tobacco Use   Smoking status: Former   Smokeless tobacco: Never  Substance and Sexual Activity   Alcohol use: No   Drug use: No   Sexual activity: Not on file  Other Topics Concern   Not on file  Social History Narrative   Not on file   Social Determinants of Health   Financial Resource Strain: Low Risk  (03/12/2023)   Received from [facility]   Overall Financial Resource Strain (CARDIA)    Difficulty of Paying Living Expenses: Not hard at all  Food Insecurity: No Food Insecurity (03/12/2023)   Received from [facility]   Hunger Vital Sign    Worried About Running Out of Food in the Last Year: Never true    Ran Out of Food in the Last Year: Never true  Transportation Needs: No Transportation Needs (03/12/2023)   Received from [facility]   PRAPARE - Transportation    Lack of Transportation (Medical): No    Lack of Transportation (Non-Medical): No  Physical Activity: Not on file  Stress: Not on file  Social Connections: Unknown (11/12/2021)   Received from [facility]   Social Network    Social Network: Not on file  Intimate Partner Violence: Unknown (10/03/2021)   Received from [facility], [facility]   HITS    Physically Hurt: Not on file    Insult or Talk Down To: Not on file    Threaten Physical Harm: Not on file    Scream or Curse: Not on file    Review of Systems: See HPI, otherwise negative ROS  Physical Exam: BP 124/83   Pulse (!) 57   Temp (!) 97.4 F (36.3 C) (Temporal)   Resp 17   Ht 5\' 7"  (1.702 m)   Wt 88.5 kg   SpO2 100%   BMI 30.57 kg/m  General:   Alert,  pleasant and  cooperative in NAD Head:  Normocephalic and atraumatic. Neck:  Supple; no masses or thyromegaly. Lungs:  Clear throughout to auscultation.    Heart:  Regular rate and rhythm. Abdomen:  Soft, nontender and nondistended. Normal bowel sounds, without guarding, and without rebound.   Neurologic:  Alert and  oriented x4;  grossly normal neurologically.  Impression/Plan: Lawrence Ford is here for an colonoscopy to be performed for a history of adenomatous polyps on 2019   Risks, benefits, limitations, and alternatives regarding  colonoscopy have been reviewed with the patient.  Questions have been answered.  All parties agreeable.   Midge Minium, MD  04/24/2023, 8:14 AM

## 2023-04-24 NOTE — Anesthesia Procedure Notes (Signed)
Procedure Name: General with mask airway Date/Time: 04/24/2023 8:26 AM  Performed by: Mohammed Kindle, CRNAPre-anesthesia Checklist: Emergency Drugs available, Suction available, Patient being monitored and Patient identified Patient Re-evaluated:Patient Re-evaluated prior to induction Oxygen Delivery Method: Simple face mask Induction Type: IV induction Placement Confirmation: positive ETCO2, CO2 detector and breath sounds checked- equal and bilateral Dental Injury: Teeth and Oropharynx as per pre-operative assessment

## 2023-04-24 NOTE — Anesthesia Preprocedure Evaluation (Signed)
Anesthesia Evaluation  Patient identified by MRN, date of birth, ID band Patient awake    Reviewed: Allergy & Precautions, NPO status , Patient's Chart, lab work & pertinent test results  History of Anesthesia Complications Negative for: history of anesthetic complications  Airway Mallampati: III  TM Distance: >3 FB Neck ROM: full    Dental  (+) Chipped, Caps   Pulmonary neg pulmonary ROS, neg shortness of breath, former smoker   Pulmonary exam normal        Cardiovascular Exercise Tolerance: Good (-) angina negative cardio ROS Normal cardiovascular exam     Neuro/Psych negative neurological ROS  negative psych ROS   GI/Hepatic negative GI ROS, Neg liver ROS,neg GERD  ,,  Endo/Other  negative endocrine ROS    Renal/GU negative Renal ROS  negative genitourinary   Musculoskeletal   Abdominal   Peds  Hematology negative hematology ROS (+)   Anesthesia Other Findings Past Medical History: No date: Cancer [facility])     Comment:  prostate  Past Surgical History: 04/07/2018: COLONOSCOPY WITH PROPOFOL; N/A     Comment:  Procedure: COLONOSCOPY WITH PROPOFOL;  Surgeon: Midge Minium, MD;  Location: [facility] ENDOSCOPY;  Service:               Endoscopy;  Laterality: N/A; No date: JOINT REPLACEMENT No date: KNEE SURGERY No date: PROSTATECTOMY  BMI    Body Mass Index: 30.57 kg/m      Reproductive/Obstetrics negative OB ROS                             Anesthesia Physical Anesthesia Plan  ASA: 2  Anesthesia Plan: General   Post-op Pain Management:    Induction: Intravenous  PONV Risk Score and Plan: Propofol infusion and TIVA  Airway Management Planned: Natural Airway and Nasal Cannula  Additional Equipment:   Intra-op Plan:   Post-operative Plan:   Informed Consent: I have reviewed the patients History and Physical, chart, labs and discussed the procedure including the  risks, benefits and alternatives for the proposed anesthesia with the patient or authorized representative who has indicated his/her understanding and acceptance.     Dental Advisory Given  Plan Discussed with: Anesthesiologist, CRNA and Surgeon  Anesthesia Plan Comments: (Patient consented for risks of anesthesia including but not limited to:  - adverse reactions to medications - risk of airway placement if required - damage to eyes, teeth, lips or other oral mucosa - nerve damage due to positioning  - sore throat or hoarseness - Damage to heart, brain, nerves, lungs, other parts of body or loss of life  Patient voiced understanding and assent.)       Anesthesia Quick Evaluation

## 2023-04-24 NOTE — Anesthesia Postprocedure Evaluation (Signed)
Anesthesia Post Note  Patient: Lawrence Ford  Procedure(s) Performed: COLONOSCOPY WITH PROPOFOL POLYPECTOMY  Patient location during evaluation: Endoscopy Anesthesia Type: General Level of consciousness: awake and alert Pain management: pain level controlled Vital Signs Assessment: post-procedure vital signs reviewed and stable Respiratory status: spontaneous breathing, nonlabored ventilation, respiratory function stable and patient connected to nasal cannula oxygen Cardiovascular status: blood pressure returned to baseline and stable Postop Assessment: no apparent nausea or vomiting Anesthetic complications: no   No notable events documented.   Last Vitals:  Vitals:   04/24/23 0845 04/24/23 0855  BP: 116/68 108/77  Pulse: 75 78  Resp:    Temp: (!) 36.4 C (!) 36.4 C  SpO2: 100% 99%    Last Pain:  Vitals:   04/24/23 0855  TempSrc: Temporal  PainSc: 0-No pain                 Cleda Mccreedy Kally Cadden

## 2023-04-24 NOTE — Op Note (Signed)
[facility] Gastroenterology Patient Name: Lawrence Ford Procedure Date: 04/24/2023 8:19 AM MRN: [id number] Account #: [id number] Date of Birth: 1961/04/16 Admit Type: Outpatient Age: 62 Room: [facility] ENDO ROOM 4 Gender: Male Note Status: Finalized Instrument Name: Prentice Docker [id number] Procedure:             Colonoscopy Indications:           High risk colon cancer surveillance: Personal history                         of colonic polyps Providers:             Midge Minium MD, MD Referring MD:          No Local Md, MD (Referring MD) Medicines:             Propofol per Anesthesia Complications:         No immediate complications. Procedure:             Pre-Anesthesia Assessment:                        - Prior to the procedure, a History and Physical was                         performed, and patient medications and allergies were                         reviewed. The patient's tolerance of previous                         anesthesia was also reviewed. The risks and benefits                         of the procedure and the sedation options and risks                         were discussed with the patient. All questions were                         answered, and informed consent was obtained. Prior                         Anticoagulants: The patient has taken no anticoagulant                         or antiplatelet agents. ASA Grade Assessment: II - A                         patient with mild systemic disease. After reviewing                         the risks and benefits, the patient was deemed in                         satisfactory condition to undergo the procedure.                        After obtaining informed consent, the colonoscope was  passed under direct vision. Throughout the procedure,                         the patient's blood pressure, pulse, and oxygen                         saturations were monitored continuously. The                          Colonoscope was introduced through the anus and                         advanced to the the cecum, identified by appendiceal                         orifice and ileocecal valve. The colonoscopy was                         performed without difficulty. The patient tolerated                         the procedure well. The quality of the bowel                         preparation was excellent. Findings:      The perianal and digital rectal examinations were normal.      A 4 mm polyp was found in the sigmoid colon. The polyp was sessile. The       polyp was removed with a cold biopsy forceps. Resection and retrieval       were complete.      Non-bleeding internal hemorrhoids were found during retroflexion. The       hemorrhoids were Grade I (internal hemorrhoids that do not prolapse). Impression:            - One 4 mm polyp in the sigmoid colon, removed with a                         cold biopsy forceps. Resected and retrieved.                        - Non-bleeding internal hemorrhoids. Recommendation:        - Discharge patient to home.                        - Resume previous diet.                        - Continue present medications.                        - Await pathology results.                        - Repeat colonoscopy in 7 years for surveillance. Procedure Code(s):     --- Professional ---                        [phone], Colonoscopy, flexible; with biopsy, single or  multiple Diagnosis Code(s):     --- Professional ---                        Z86.010, Personal history of colonic polyps                        D12.5, Benign neoplasm of sigmoid colon CPT copyright 2022 [employer]. All rights reserved. The codes documented in this report are preliminary and upon coder review may  be revised to meet current compliance requirements. Midge Minium MD, MD 04/24/2023 8:34:28 AM This report has been signed electronically. Number of  Addenda: 0 Note Initiated On: 04/24/2023 8:19 AM Scope Withdrawal Time: 0 hours 7 minutes 48 seconds  Total Procedure Duration: 0 hours 9 minutes 27 seconds  Estimated Blood Loss:  Estimated blood loss: none.      [facility]

## 2023-04-25 ENCOUNTER — Encounter: Payer: Self-pay | Admitting: Gastroenterology

## 2023-04-25 LAB — SURGICAL PATHOLOGY

## 2024-01-08 ENCOUNTER — Other Ambulatory Visit: Payer: Self-pay

## 2024-01-08 ENCOUNTER — Emergency Department (HOSPITAL_BASED_OUTPATIENT_CLINIC_OR_DEPARTMENT_OTHER): Admitting: Radiology

## 2024-01-08 ENCOUNTER — Emergency Department (HOSPITAL_BASED_OUTPATIENT_CLINIC_OR_DEPARTMENT_OTHER)
Admission: EM | Admit: 2024-01-08 | Discharge: 2024-01-08 | Disposition: A | Discharge status: Home / Self Care | Attending: Emergency Medicine | Admitting: Emergency Medicine

## 2024-01-08 ENCOUNTER — Encounter (HOSPITAL_BASED_OUTPATIENT_CLINIC_OR_DEPARTMENT_OTHER): Payer: Self-pay | Admitting: Emergency Medicine

## 2024-01-08 DIAGNOSIS — Y9343 Activity, gymnastics: Secondary | ICD-10-CM | POA: Diagnosis not present

## 2024-01-08 DIAGNOSIS — S299XXA Unspecified injury of thorax, initial encounter: Secondary | ICD-10-CM | POA: Diagnosis present

## 2024-01-08 DIAGNOSIS — S29011A Strain of muscle and tendon of front wall of thorax, initial encounter: Secondary | ICD-10-CM | POA: Insufficient documentation

## 2024-01-08 DIAGNOSIS — X500XXA Overexertion from strenuous movement or load, initial encounter: Secondary | ICD-10-CM | POA: Insufficient documentation

## 2024-01-08 MED ORDER — HYDROCODONE-ACETAMINOPHEN 5-325 MG PO TABS
1.0000 | ORAL_TABLET | Freq: Four times a day (QID) | ORAL | 0 refills | Status: AC | PRN
Start: 2024-01-08 — End: ?

## 2024-01-08 MED ORDER — KETOROLAC TROMETHAMINE 60 MG/2ML IM SOLN
60.0000 mg | Freq: Once | INTRAMUSCULAR | Status: AC
  Administered 2024-01-08: 60 mg via INTRAMUSCULAR
  Filled 2024-01-08: qty 2

## 2024-01-08 MED ORDER — IBUPROFEN 600 MG PO TABS: 600.0000 mg | ORAL_TABLET | Freq: Three times a day (TID) | ORAL | 0 refills | Status: AC | PRN

## 2024-01-08 MED ORDER — HYDROMORPHONE HCL 1 MG/ML IJ SOLN
1.0000 mg | Freq: Once | INTRAMUSCULAR | Status: AC
  Administered 2024-01-08: 1 mg via INTRAMUSCULAR
  Filled 2024-01-08: qty 1

## 2024-01-08 NOTE — Discharge Instructions (Addendum)
 1.  You appear to have torn or ruptured your pectoralis muscle.  Most likely your pectoralis minor.  You will need to follow-up with an orthopedic doctor for recheck.  Occasionally these types of muscle tears need a surgical repair for full function again in very active individuals. 2.  Wear the sling for comfort.  You may remove it for bathing and changing clothes.  You may also remove it to lightly stretch the arm and rotated the shoulder to avoid stiffness of the shoulder. 3.  You may take ibuprofen  every 8 hours for your baseline pain control.  Add 1-2 Percocet every 6 hours if needed for additional pain control.  You should be icing the area is much as possible.  Keep a well wrapped ice pack on for about 20 minutes at a time every couple of hours.  Take care not to damage your skin from excessive direct cold contact.

## 2024-01-08 NOTE — ED Notes (Signed)
 Ice pack given

## 2024-01-08 NOTE — ED Provider Notes (Signed)
 [employer] [facility] EMERGENCY DEPARTMENT AT [facility] Provider Note   CSN: [id number] Arrival date & time: 01/08/24  8177     Patient presents with: No chief complaint on file.   Lawrence Ford is a 63 y.o. male.   HPI The patient was at the gym doing bench presses.  He reports that he was pressing a weight and he felt a deep tearing feeling right at the outer edge of his left upper chest toward the shoulder.  He reports there was no ossific pop but he could kind of feel things straining or tearing.  After that, he has had severe pain right in that area.  He can still move the shoulder and does not have numbness or weakness into the arm.  No shortness of breath or difficulty breathing.  Patient does not take any blood thinners.    Prior to Admission medications   Medication Sig Start Date End Date Taking? Authorizing Provider  HYDROcodone -acetaminophen  (NORCO/VICODIN) 5-325 MG tablet Take 1-2 tablets by mouth every 6 (six) hours as needed. 01/08/24  Yes Armenta Canning, MD  ibuprofen  (ADVIL ) 600 MG tablet Take 1 tablet (600 mg total) by mouth every 8 (eight) hours as needed. 01/08/24  Yes Armenta Canning, MD  atorvastatin (LIPITOR) 40 MG tablet Take 20 mg by mouth daily.  Patient not taking: Reported on 02/26/2023    [provider]  cephALEXin  (KEFLEX ) 500 MG capsule Take 1 capsule (500 mg total) by mouth 4 (four) times daily. Patient not taking: Reported on 02/26/2023 11/04/19   Windle Almarie ORN, PA-C  cyclobenzaprine  (FLEXERIL ) 10 MG tablet Take 1 tablet (10 mg total) by mouth 2 (two) times daily as needed for muscle spasms. 02/26/23   Ladora Congress, PA  doxycycline  (VIBRAMYCIN ) 100 MG capsule Take 1 capsule (100 mg total) by mouth 2 (two) times daily. Patient not taking: Reported on 02/26/2023 11/04/19   Windle Almarie ORN, PA-C  ibuprofen  (ADVIL ) 800 MG tablet Take 1 tablet (800 mg total) by mouth 3 (three) times daily. 08/24/21   Prosperi, Christian H, PA-C  predniSONE   (DELTASONE ) 20 MG tablet Take 3 pills today and tomorrow Patient not taking: Reported on 02/26/2023 05/12/18   Maranda Jamee Jacob, MD  ranitidine (ZANTAC) 150 MG tablet Take 150 mg by mouth every evening. Patient not taking: Reported on 02/26/2023    [provider]    Allergies: No known allergies    Review of Systems  Updated Vital Signs BP (!) 156/102   Pulse 74   Temp 97.8 F (36.6 C) (Oral)   Resp 18   SpO2 93%   Physical Exam Constitutional:      Comments: Alert nontoxic no respiratory distress.  Patient is in pain.  Mental status is clear.  Well-nourished well-developed.  HENT:     Mouth/Throat:     Pharynx: Oropharynx is clear.  Eyes:     Extraocular Movements: Extraocular movements intact.  Cardiovascular:     Rate and Rhythm: Normal rate and regular rhythm.  Pulmonary:     Effort: Pulmonary effort is normal.     Breath sounds: Normal breath sounds.     Comments: Patient has tenderness and slightly full swollen appearance at the insertion looks to be the pectoralis minor just medial to the shoulder joint itself.  The shoulder is aligned. Abdominal:     General: There is no distension.     Palpations: Abdomen is soft.  Musculoskeletal:     Cervical back: Neck supple.  Comments: In seated position shoulder does not show any deformity.  Patient has a fullness at the top lateral aspect of his pectoralis right anterior to the shoulder.  This area is specifically tender.  Patient can elevate the shoulders.  Skin:    General: Skin is warm and dry.  Neurological:     General: No focal deficit present.     Mental Status: He is oriented to person, place, and time.     (all labs ordered are listed, but only abnormal results are displayed) Labs Reviewed - No data to display  EKG: None  Radiology: DG Chest 2 View Result Date: 01/08/2024 CLINICAL DATA:  Left shoulder/axilla pain while lifting at the gym. EXAM: CHEST - 2 VIEW COMPARISON:  None Available.  FINDINGS: The heart size and mediastinal contours are within normal limits. Low lung volumes are noted. Both lungs are clear. The visualized skeletal structures are unremarkable. IMPRESSION: No active cardiopulmonary disease. Electronically Signed   By: Suzen Dials M.D.   On: 01/08/2024 20:05     Procedures   Medications Ordered in the ED  HYDROmorphone  (DILAUDID ) injection 1 mg (1 mg Intramuscular Given 01/08/24 1924)  ketorolac  (TORADOL ) injection 60 mg (60 mg Intramuscular Given 01/08/24 1923)                                    Medical Decision Making Amount and/or Complexity of Data Reviewed Radiology: ordered.  Risk Prescription drug management.   Patient presents as outlined above.  Mechanism and physical exam finding is consistent with a muscle tear either of the pectoralis or major or minor.  This does not appear to be at the rotator cuff.  Patient does have intact motion of the shoulder joint itself.  Patient denies any shortness of breath and has symmetric breath sounds.  At this time no sign of pneumothorax.  Patient is reporting a lot of pain.  Will treat with IM Toradol  and Dilaudid .   2 view chest x-ray interpreted by radiology, no acute findings.    Patient has gotten pain relief with combination of Toradol  and Dilaudid  with icing.  Patient will be discharged with sling to use for comfort with instructions for light stretching exercises.  Patient is instructed that he needs to follow-up with orthopedics for assessment for possible need for repair of ruptured pectoralis muscle.     Final diagnoses:  Strain of left pectoralis muscle, initial encounter    ED Discharge Orders          Ordered    ibuprofen  (ADVIL ) 600 MG tablet  Every 8 hours PRN        01/08/24 2027    HYDROcodone -acetaminophen  (NORCO/VICODIN) 5-325 MG tablet  Every 6 hours PRN        01/08/24 2027               Armenta Canning, MD 01/08/24 2037

## 2024-01-08 NOTE — ED Triage Notes (Signed)
 Pt was at gym ,lifting and his left shoulder/axilla area started hurting.

## 2024-01-08 NOTE — ED Notes (Signed)
 Reviewed AVS/discharge instruction with patient. Time allotted for and all questions answered. Patient is agreeable for d/c and escorted to ed exit by staff.

## 2024-01-14 IMAGING — DX DG WRIST COMPLETE 3+V*L*
4 series · 4 of 4 positions shown · non-contrast
Comparison: None.

CLINICAL DATA: Wrist pain

EXAM:
LEFT WRIST - COMPLETE 3+ VIEW

[wrist ap (1 of 2)]
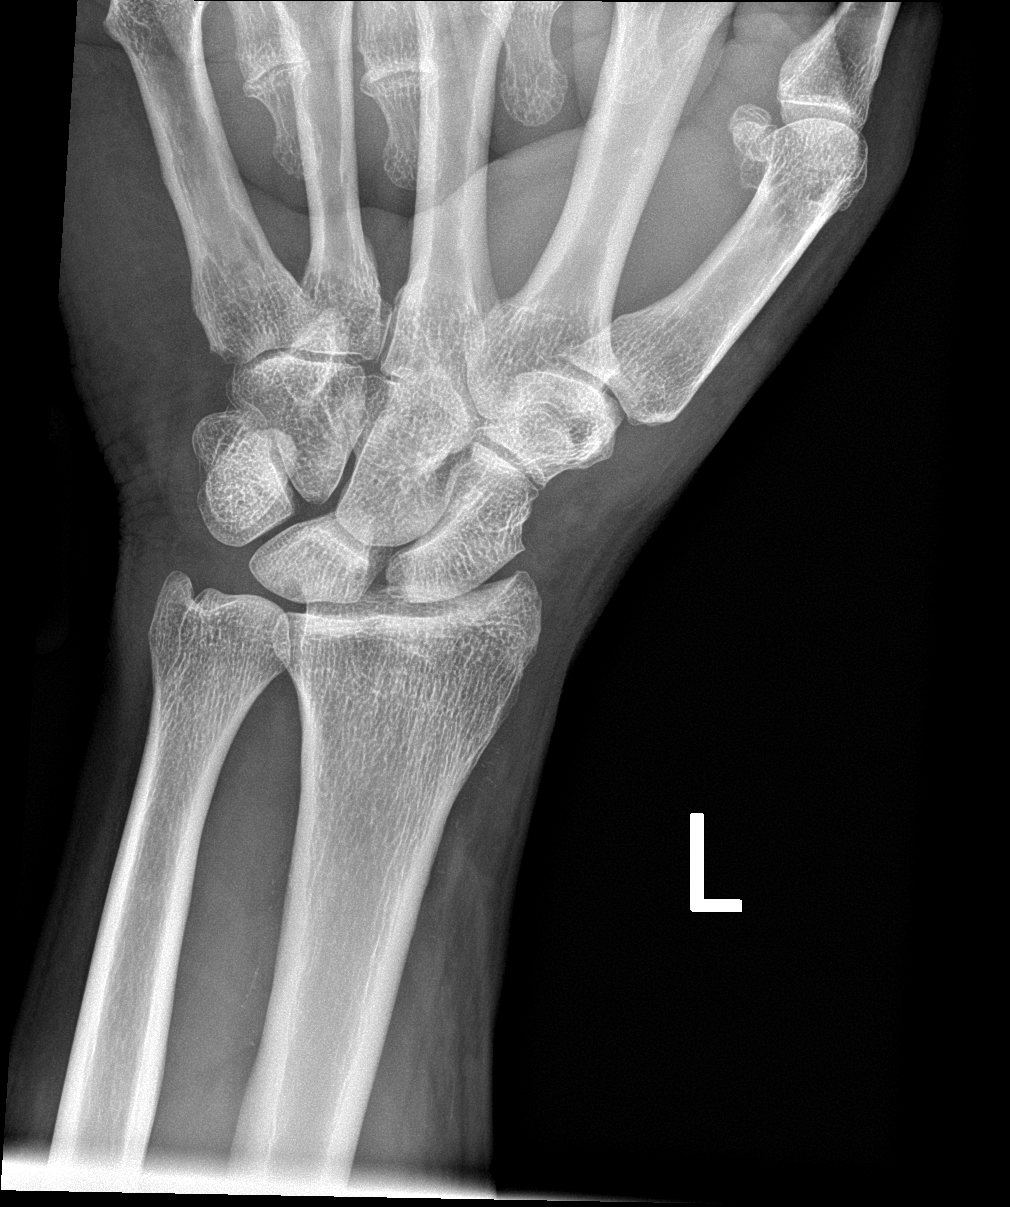

[wrist obl]
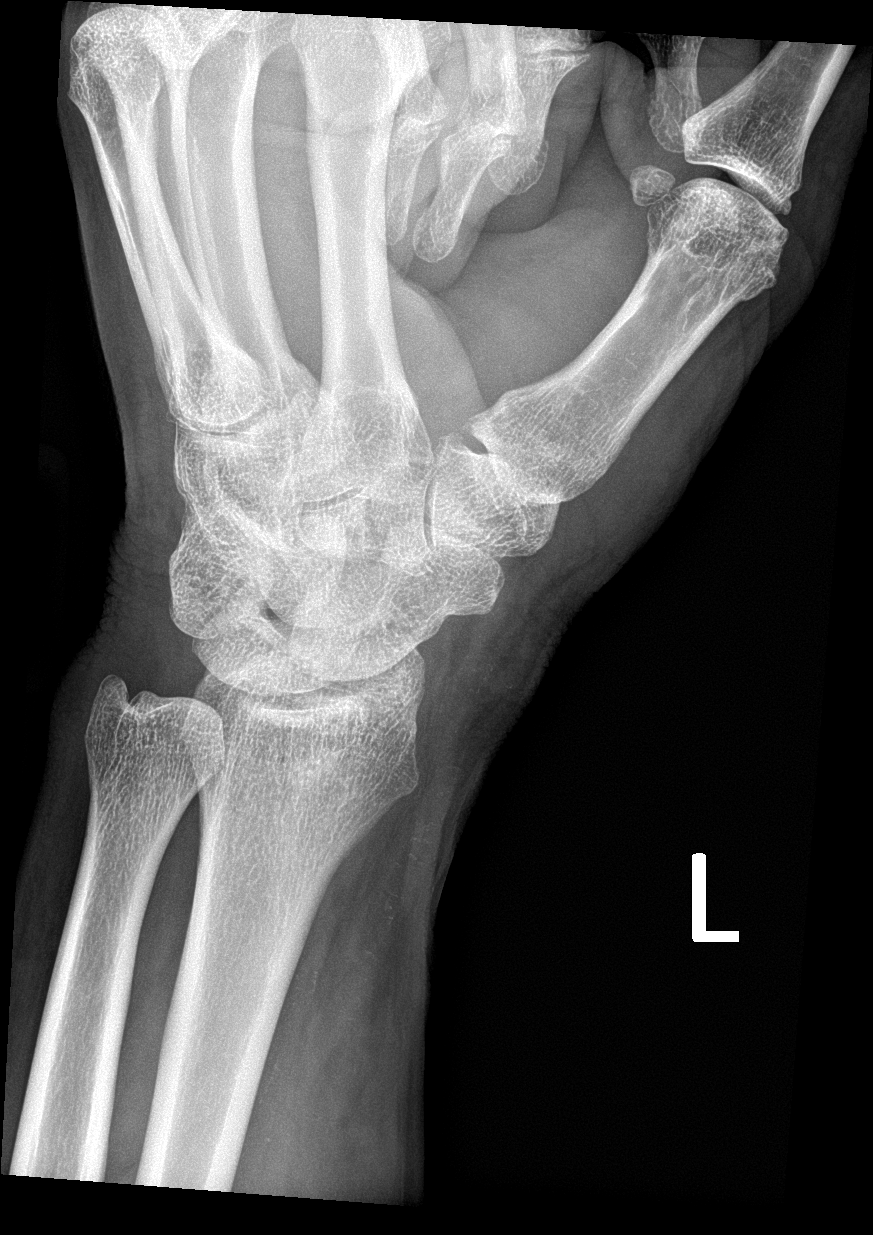

[wrist lat]
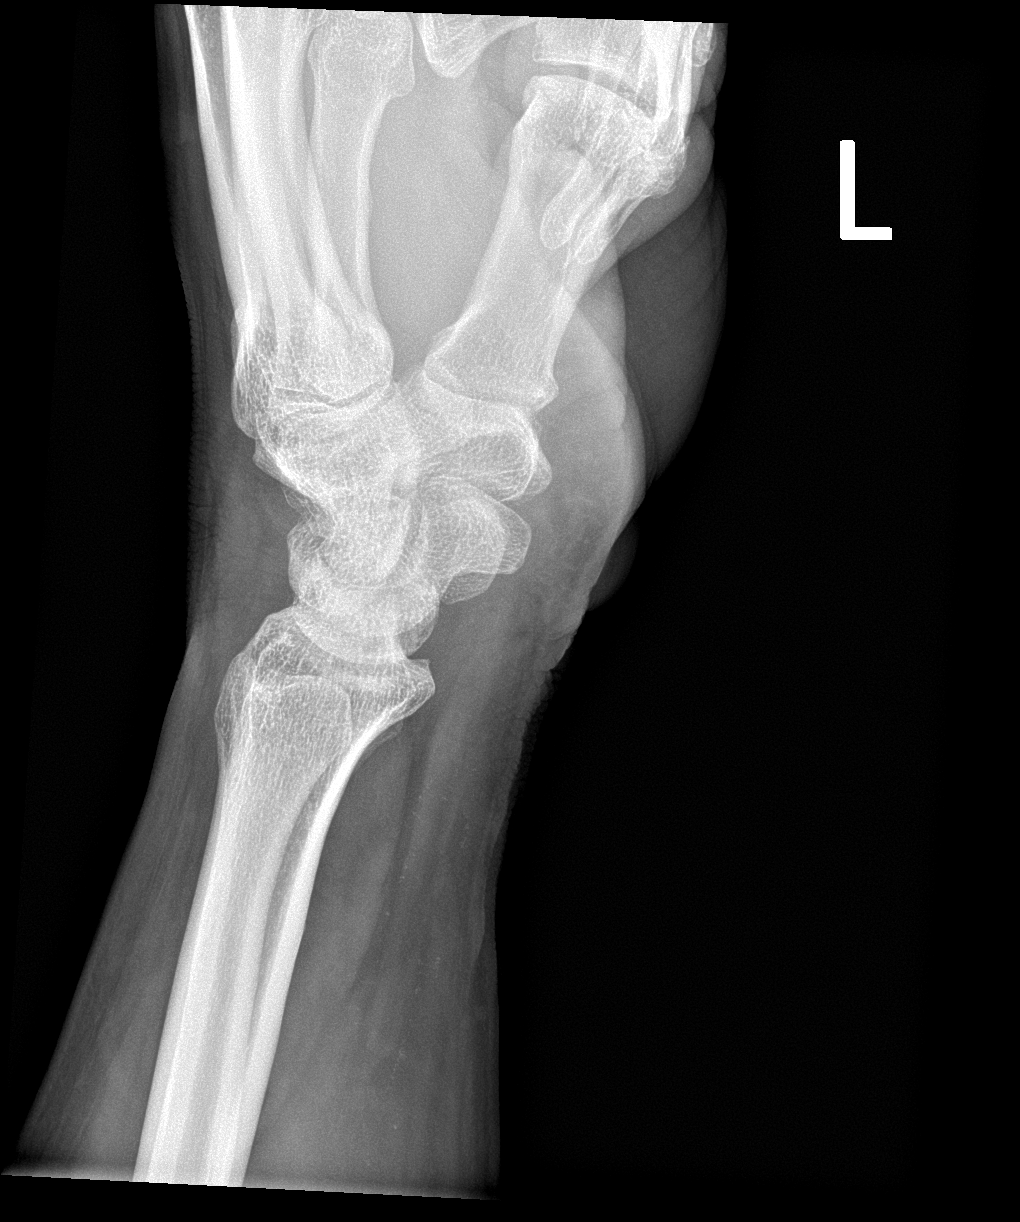

[wrist ap (2 of 2)]
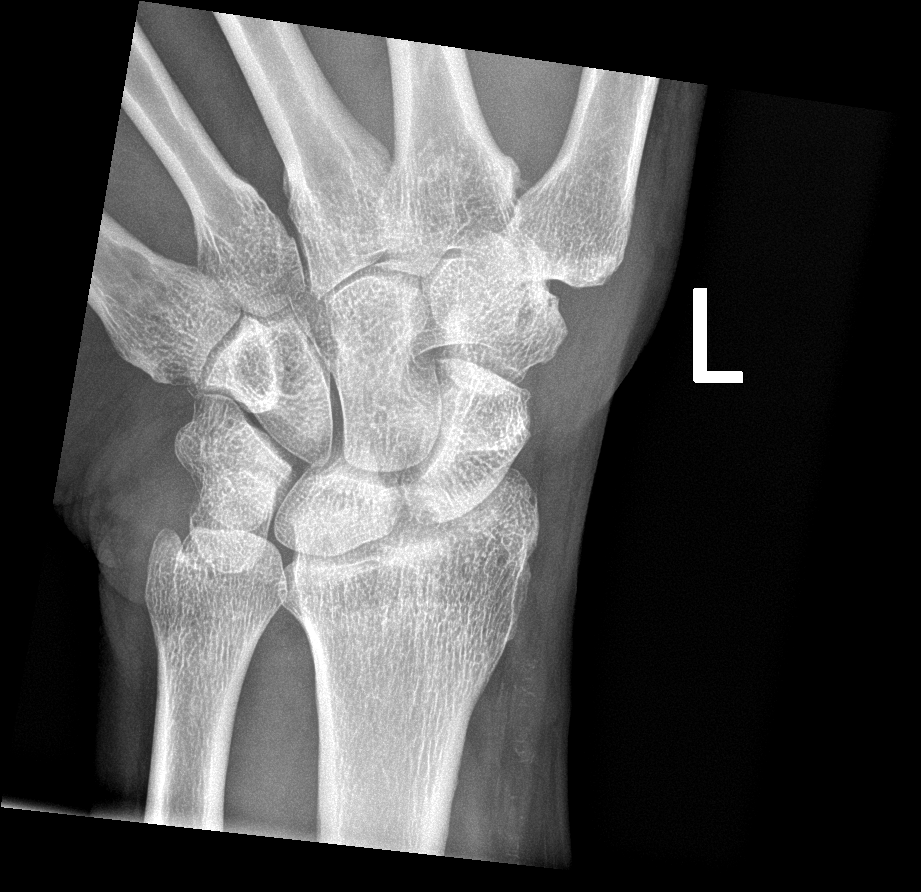

[4 of 4 positions shown; findings below may reference images not displayed]

FINDINGS: There is no evidence of acute fracture or dislocation. There is
minimal radiocarpal and base of thumb degenerative change. Vascular
calcifications.
IMPRESSION: No acute osseous abnormality.
